# Patient Record
Sex: Male | Born: 1950 | Race: White | Hispanic: No | Smoking: Never smoker
Health system: Southern US, Community
[De-identification: ages and names within clinical notes are randomized; demographics above are authoritative.]

## PROBLEM LIST (undated history)

## (undated) DIAGNOSIS — L719 Rosacea, unspecified: Secondary | ICD-10-CM

## (undated) DIAGNOSIS — Z860100 Personal history of colon polyps, unspecified: Secondary | ICD-10-CM

## (undated) DIAGNOSIS — I471 Supraventricular tachycardia, unspecified: Secondary | ICD-10-CM

## (undated) DIAGNOSIS — R079 Chest pain, unspecified: Secondary | ICD-10-CM

## (undated) DIAGNOSIS — Z9289 Personal history of other medical treatment: Secondary | ICD-10-CM

## (undated) DIAGNOSIS — K5790 Diverticulosis of intestine, part unspecified, without perforation or abscess without bleeding: Secondary | ICD-10-CM

## (undated) DIAGNOSIS — K589 Irritable bowel syndrome without diarrhea: Secondary | ICD-10-CM

## (undated) HISTORY — DX: Chest pain, unspecified: R07.9

## (undated) HISTORY — DX: Supraventricular tachycardia, unspecified: I47.10

## (undated) HISTORY — DX: Personal history of colon polyps, unspecified: Z86.0100

## (undated) HISTORY — DX: Personal history of other medical treatment: Z92.89

## (undated) HISTORY — DX: Diverticulosis of intestine, part unspecified, without perforation or abscess without bleeding: K57.90

## (undated) HISTORY — DX: Rosacea, unspecified: L71.9

## (undated) HISTORY — DX: Personal history of colonic polyps: Z86.010

## (undated) HISTORY — PX: COLONOSCOPY: SHX174

## (undated) HISTORY — DX: Irritable bowel syndrome, unspecified: K58.9

## (undated) HISTORY — DX: Supraventricular tachycardia: I47.1

---
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
## 2002-06-29 ENCOUNTER — Emergency Department (HOSPITAL_COMMUNITY): Admission: EM | Admit: 2002-06-29 | Discharge: 2002-06-29 | Payer: Self-pay | Admitting: Emergency Medicine

## 2005-07-27 ENCOUNTER — Ambulatory Visit: Payer: Self-pay | Admitting: Gastroenterology

## 2005-08-10 ENCOUNTER — Ambulatory Visit: Payer: Self-pay | Admitting: Gastroenterology

## 2005-09-11 ENCOUNTER — Ambulatory Visit (HOSPITAL_COMMUNITY): Admission: RE | Admit: 2005-09-11 | Discharge: 2005-09-11 | Payer: Self-pay | Admitting: Gastroenterology

## 2009-05-27 ENCOUNTER — Observation Stay (HOSPITAL_COMMUNITY): Admission: EM | Admit: 2009-05-27 | Discharge: 2009-05-28 | Payer: Self-pay | Admitting: Emergency Medicine

## 2010-07-19 ENCOUNTER — Encounter: Payer: Self-pay | Admitting: Gastroenterology

## 2010-09-07 NOTE — Letter (Signed)
Summary: Colonoscopy Letter  [facility]  [address]   [place], [place] [address]   Phone: [phone]  Fax: [phone]      July 19, 2010 MRN: [id number]   Jeffrey Jimenez [address] [place], [place]  [address]   Dear [name],   According to your medical record, it is time for you to schedule a Colonoscopy. The American Cancer Society recommends this procedure as a method to detect early colon cancer. Patients with a family history of colon cancer, or a personal history of colon polyps or inflammatory bowel disease are at increased risk.  This letter has been generated based on the recommendations made at the time of your procedure. If you feel that in your particular situation this may no longer apply, please contact our office.  Please call our office at [phone] to schedule this appointment or to update your records at your earliest convenience.  Thank you for cooperating with [place] to provide you with the very best care possible.   Sincerely,   Claudette Head, M.D.  [facility] [phone]

## 2010-11-09 LAB — CARDIAC PANEL(CRET KIN+CKTOT+MB+TROPI)
CK, MB: 2.8 ng/mL (ref 0.3–4.0)
Total CK: 76 U/L (ref 7–232)
Total CK: 79 U/L (ref 7–232)
Troponin I: 0.19 ng/mL — ABNORMAL HIGH (ref 0.00–0.06)

## 2010-11-09 LAB — POCT CARDIAC MARKERS
CKMB, poc: 1.5 ng/mL (ref 1.0–8.0)
Myoglobin, poc: 62 ng/mL (ref 12–200)
Troponin i, poc: <0.05 ng/mL (ref 0.00–0.09)

## 2010-11-09 LAB — CK TOTAL AND CKMB (NOT AT ARMC): Relative Index: INVALID (ref 0.0–2.5)

## 2010-11-09 LAB — BASIC METABOLIC PANEL
BUN: 10 mg/dL (ref 6–23)
CO2: 28 mEq/L (ref 19–32)
CO2: 28 mEq/L (ref 19–32)
Calcium: 8.9 mg/dL (ref 8.4–10.5)
Calcium: 8.9 mg/dL (ref 8.4–10.5)
Chloride: 108 mEq/L (ref 96–112)
Creatinine, Ser: 0.91 mg/dL (ref 0.4–1.5)
GFR calc Af Amer: >60 mL/min (ref >60)
GFR calc non Af Amer: >60 mL/min (ref >60)
Glucose, Bld: 88 mg/dL (ref 70–99)
Potassium: 4.1 mEq/L (ref 3.5–5.1)
Sodium: 140 mEq/L (ref 135–145)
Sodium: 143 mEq/L (ref 135–145)

## 2010-11-09 LAB — DIFFERENTIAL
Basophils Absolute: 0 10*3/uL (ref 0.0–0.1)
Basophils Relative: 1 % (ref 0–1)
Eosinophils Relative: 1 % (ref 0–5)
Lymphs Abs: 1.9 10*3/uL (ref 0.7–4.0)
Monocytes Absolute: 0.9 10*3/uL (ref 0.1–1.0)
Neutro Abs: 6.7 10*3/uL (ref 1.7–7.7)
Neutrophils Relative %: 70 % (ref 43–77)

## 2010-11-09 LAB — TROPONIN I: Troponin I: 0.3 ng/mL — ABNORMAL HIGH (ref 0.00–0.06)

## 2012-04-24 ENCOUNTER — Encounter: Payer: Self-pay | Admitting: Gastroenterology

## 2012-11-10 ENCOUNTER — Emergency Department (HOSPITAL_BASED_OUTPATIENT_CLINIC_OR_DEPARTMENT_OTHER)
Admission: EM | Admit: 2012-11-10 | Discharge: 2012-11-10 | Disposition: A | Discharge status: Home / Self Care | Payer: BC Managed Care – PPO | Attending: Emergency Medicine | Admitting: Emergency Medicine

## 2012-11-10 ENCOUNTER — Encounter (HOSPITAL_BASED_OUTPATIENT_CLINIC_OR_DEPARTMENT_OTHER): Payer: Self-pay | Admitting: *Deleted

## 2012-11-10 DIAGNOSIS — I498 Other specified cardiac arrhythmias: Secondary | ICD-10-CM | POA: Insufficient documentation

## 2012-11-10 DIAGNOSIS — Z7982 Long term (current) use of aspirin: Secondary | ICD-10-CM | POA: Insufficient documentation

## 2012-11-10 DIAGNOSIS — I471 Supraventricular tachycardia: Secondary | ICD-10-CM

## 2012-11-10 DIAGNOSIS — R002 Palpitations: Secondary | ICD-10-CM | POA: Insufficient documentation

## 2012-11-10 DIAGNOSIS — Z79899 Other long term (current) drug therapy: Secondary | ICD-10-CM | POA: Insufficient documentation

## 2012-11-10 DIAGNOSIS — R42 Dizziness and giddiness: Secondary | ICD-10-CM | POA: Insufficient documentation

## 2012-11-10 HISTORY — DX: Supraventricular tachycardia, unspecified: I47.10

## 2012-11-10 HISTORY — DX: Supraventricular tachycardia: I47.1

## 2012-11-10 LAB — BASIC METABOLIC PANEL
BUN: 15 mg/dL (ref 6–23)
Chloride: 104 mEq/L (ref 96–112)
GFR calc Af Amer: 80 mL/min — ABNORMAL LOW (ref >90)
GFR calc non Af Amer: 69 mL/min — ABNORMAL LOW (ref >90)

## 2012-11-10 MED ORDER — ADENOSINE 6 MG/2ML IV SOLN
12.0000 mg | Freq: Once | INTRAVENOUS | Status: AC
  Administered 2012-11-10: 12 mg via INTRAVENOUS

## 2012-11-10 MED ORDER — ADENOSINE 6 MG/2ML IV SOLN
INTRAVENOUS | Status: AC
  Administered 2012-11-10: 6 mg
  Filled 2012-11-10: qty 6

## 2012-11-10 NOTE — ED Provider Notes (Signed)
History     CSN: [id number]  Arrival date & time 11/10/12  0848   First MD Initiated Contact with Patient 11/10/12 0848      Chief Complaint  Patient presents with  . Tachycardia    (Consider location/radiation/quality/duration/timing/severity/associated sxs/prior treatment) The history is provided by the patient.   patient has a history of paroxysmal SVT and states around 745 this morning he began to feel an episode come on. He states it feels sort of lightheaded with it. No chest pain. He states he did develop an indigestion feeling with it. No fevers. No cough. No recent caffeine intake. No relief with. With maneuvers at home. No relief with 90 mg of Cardizem that he took it home. His been doing well or weekend. He states that he did a lot of work. He sees Dr. Anne Fu for his SVT. Past Medical History  Diagnosis Date  . SVT (supraventricular tachycardia)     No past surgical history on file.  No family history on file.  History  Substance Use Topics  . Smoking status: Not on file  . Smokeless tobacco: Not on file  . Alcohol Use: Not on file      Review of Systems  Constitutional: Negative for activity change and appetite change.  HENT: Negative for neck stiffness.   Eyes: Negative for pain.  Respiratory: Negative for chest tightness and shortness of breath.   Cardiovascular: Positive for palpitations. Negative for chest pain and leg swelling.  Gastrointestinal: Negative for nausea, vomiting, abdominal pain and diarrhea.  Genitourinary: Negative for flank pain.  Musculoskeletal: Negative for back pain.  Skin: Negative for rash.  Neurological: Positive for light-headedness. Negative for weakness, numbness and headaches.  Psychiatric/Behavioral: Negative for behavioral problems.    Allergies  Review of patient's allergies indicates no known allergies.  Home Medications   Current Outpatient Rx  Name  Route  Sig  Dispense  Refill  . aspirin 81 MG tablet   Oral  Take 81 mg by mouth daily.         Marland Kitchen diltiazem (CARDIZEM) 90 MG tablet   Oral   Take 90 mg by mouth as needed.           BP 103/64  Pulse 82  Temp(Src) 98 F (36.7 C) (Oral)  Resp 18  SpO2 100%  Physical Exam  Nursing note and vitals reviewed. Constitutional: He is oriented to person, place, and time. He appears well-developed and well-nourished.  HENT:  Head: Normocephalic and atraumatic.  Eyes: EOM are normal. Pupils are equal, round, and reactive to light.  Neck: Normal range of motion. Neck supple.  Cardiovascular: Regular rhythm and normal heart sounds.   No murmur heard. Tachycardia  Pulmonary/Chest: Effort normal and breath sounds normal.  Abdominal: Soft. Bowel sounds are normal. He exhibits no distension and no mass. There is no tenderness. There is no rebound and no guarding.  Musculoskeletal: Normal range of motion. He exhibits no edema.  Neurological: He is alert and oriented to person, place, and time. No cranial nerve deficit.  Skin: Skin is warm and dry.  Psychiatric: He has a normal mood and affect.    ED Course  Procedures (including critical care time)  Labs Reviewed  BASIC METABOLIC PANEL - Abnormal; Notable for the following:    Glucose, Bld 111 (*)    GFR calc non Af Amer 69 (*)    GFR calc Af Amer 80 (*)    All other components within normal limits   No  results found.   1. SVT (supraventricular tachycardia)      Date: 11/10/2012  Rate: 175  Rhythm: supraventricular tachycardia (SVT)  QRS Axis: normal  Intervals: normal  ST/T Wave abnormalities: nonspecific ST/T changes  Conduction Disutrbances:none  Narrative Interpretation:   Old EKG Reviewed: none available   Date: 11/10/2012  Rate: 84  Rhythm: normal sinus rhythm  QRS Axis: normal  Intervals: normal  ST/T Wave abnormalities: normal  Conduction Disutrbances: none  Narrative Interpretation: unremarkable      MDM  Patient with SVT. Converted after 12 mg of adenosine  back to sinus rhythm. Repeat EKG is normal. Patient has a history of same. He will follow with his cardiologist. He does not want to be started on any prophylactic medications at this time. Doubt ischemic cause of the SVT        Roxie Gueye R. Rubin Payor, MD 11/10/12 1032

## 2012-11-10 NOTE — ED Notes (Signed)
Patient states he feels heart racing, took diltiazem this morning

## 2012-12-22 ENCOUNTER — Encounter: Payer: Self-pay | Admitting: Internal Medicine

## 2012-12-22 ENCOUNTER — Ambulatory Visit (INDEPENDENT_AMBULATORY_CARE_PROVIDER_SITE_OTHER): Payer: BC Managed Care – PPO | Admitting: Internal Medicine

## 2012-12-22 VITALS — BP 121/84 | HR 83 | Ht 72.0 in | Wt 189.0 lb

## 2012-12-22 DIAGNOSIS — I471 Supraventricular tachycardia: Secondary | ICD-10-CM

## 2012-12-22 DIAGNOSIS — I498 Other specified cardiac arrhythmias: Secondary | ICD-10-CM

## 2012-12-22 NOTE — Progress Notes (Signed)
Primary Care Physician: Thora Lance, MD Referring Physician:  Dr Pearline Cables Jeffrey Jimenez is a 62 y.o. male with a h/o SVT who presents today for EP consultation.  He reports initially having SVT 10/10.  He got out of his truck and bent over to go into work and had abrupt onset of tachypalpitations.  He presented to [facility] and was found to have a short RP tachycardia which terminated with adenosine.  He eliminated caffeine from his diet and did well until 12/13 when he again developed abrupt onset tachypalpitations while at [facility].  He reports that he had [place]. 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  He bent over and developed abrupt tachypalpitations again for which he presented to the ER and again had terminatio with adenosine.  Most recently, he had an episode of tachycardia 11/10/12.  He reports that he had just gotten up from bed and bent over with laundry, again developing tachycardia.  He presented to [facility] with short RP SVT at 178 bpm.  This was again terminated with adenosine.  During tachycardia, he reports feeling uncomfortable and anxious.  He denies SOB or CP.  Today, he denies symptoms of palpitations, orthopnea, PND, lower extremity edema, dizziness, presyncope, syncope, or neurologic sequela. The patient is tolerating medications without difficulties and is otherwise without complaint today.   Past Medical History  Diagnosis Date  . SVT (supraventricular tachycardia)   . Rosacea   . IBS (irritable bowel syndrome)   . Diverticulosis    Past Surgical History  Procedure Laterality Date  . Tonsillectomy      Current Outpatient Prescriptions  Medication Sig Dispense Refill  . aspirin 81 MG tablet Take 81 mg by mouth daily.      Marland Kitchen diltiazem (CARDIZEM) 90 MG tablet Take 90 mg by mouth as needed.       No current facility-administered medications for this visit.    No Known Allergies  History    Social History  . Marital Status: Divorced    Spouse Name: N/A    Number of Children: N/A  . Years of Education: N/A   Occupational History  . Not on file.   Social History Main Topics  . Smoking status: Never Smoker   . Smokeless tobacco: Not on file  . Alcohol Use: No  . Drug Use: No  . Sexually Active: Not on file   Other Topics Concern  . Not on file   Social History Narrative   Lives in [place] alone.  Divorced.   Retired Production assistant, radio    Family History  Problem Relation Age of Onset  . Pancreatic cancer Mother   . Heart attack Father     ROS- All systems are reviewed and negative except as per the HPI above  Physical Exam: Filed Vitals:   12/22/12 1221  BP: 121/84  Pulse: 83  Height: 6' (1.829 m)  Weight: 189 lb (85.73 kg)    GEN- The patient is well appearing, alert and oriented x 3 today.   Head- normocephalic, atraumatic Eyes-  Sclera clear, conjunctiva pink Ears- hearing intact Oropharynx- clear Neck- supple, no JVP Lymph- no cervical lymphadenopathy Lungs- Clear to ausculation bilaterally, normal work of breathing Heart- Regular rate and rhythm, no murmurs, rubs or gallops, PMI not laterally displaced GI- soft, NT, ND, + BS Extremities- no clubbing, cyanosis, or edema MS- no significant deformity or atrophy Skin- no rash  or lesion Psych- euthymic mood, full affect Neuro- strength and sensation are intact  EKG 11/10/12 reveals short RP narrow complex tachycardia EKG today reveals sinus rhythm 81 bpm, PR 158 msec, otherwise normal ekg  Assessment and Plan:  The patient has symptomatic adenosine sensitive narrow complex short RP tachycardia. Therapeutic strategies for supraventricular tachycardia including medicine and ablation were discussed in detail with the patient today. Risk, benefits, and alternatives to EP study and radiofrequency ablation were also discussed in detail today. These risks include but are not limited to  stroke, bleeding, vascular damage, tamponade, perforation, damage to the heart and other structures, AV block requiring pacemaker, worsening renal function, and death. The patient understands these risk and wishes to further contemplate his options.  He is not ready yet to proceed with ablation.  Educational material was provided today regarding both SVT and ablation.  He will contact my office if he wishes to proceed.  Vagal maneuvers were discussed today.  He will follow-up with Dr Anne Fu and I will see as needed.

## 2012-12-22 NOTE — Patient Instructions (Addendum)
Your physician recommends that you schedule a follow-up appointment as needed   Call when ready to schedule [phone]

## 2013-05-06 ENCOUNTER — Encounter: Payer: Self-pay | Admitting: Internal Medicine

## 2013-05-07 ENCOUNTER — Encounter: Payer: Self-pay | Admitting: Internal Medicine

## 2013-05-07 ENCOUNTER — Ambulatory Visit (INDEPENDENT_AMBULATORY_CARE_PROVIDER_SITE_OTHER): Payer: BC Managed Care – PPO | Admitting: Internal Medicine

## 2013-05-07 VITALS — BP 124/74 | HR 69 | Ht 72.0 in | Wt 178.0 lb

## 2013-05-07 DIAGNOSIS — I498 Other specified cardiac arrhythmias: Secondary | ICD-10-CM

## 2013-05-07 DIAGNOSIS — I471 Supraventricular tachycardia: Secondary | ICD-10-CM

## 2013-05-07 NOTE — Patient Instructions (Signed)
Your physician recommends that you schedule a follow-up appointment as needed  

## 2013-05-07 NOTE — Assessment & Plan Note (Signed)
I've discussed the treatment options with the patient. While he is required 3 visits to the emergency room for treatment of his SVT, and had non-ST elevation MI is associated with these visits, his episodes are infrequent and are not particularly worrisome for him.  We discussed the risk, goals, benefits, and expectations of catheter ablation. We also discussed alternatives to catheter ablation.  For now, he will undergo watchful waiting. If his episodes increase in frequency and severity, catheter ablation would be a strong consideration.

## 2013-05-07 NOTE — Progress Notes (Signed)
HPI Jeffrey Jimenez returns today for followup. He is a very pleasant 62 year old man with see my partner Dr. Johney Frame approximate 6 months ago. He has a history of SVT and hospital visits for SVT dating back to 2010. Intravenous adenosine terminates the episodes of SVT. With 2 of the episodes, he had a class II MI. The patient states that these episodes start and stop suddenly. He has not had frank syncope but does get dizzy. Typically they occur when he stands up after bending over. On each occasion, the episodes have lasted for over an hour. He has not been able terminated with vagal maneuvers. He does not have evidence of preexcitation on his electrocardiogram. He has not been on medical therapy, and the episodes have been fairly infrequent, and the patient does not like to take medications. No Known Allergies   Current Outpatient Prescriptions  Medication Sig Dispense Refill  . aspirin 81 MG tablet Take 81 mg by mouth daily.      Marland Kitchen diltiazem (CARDIZEM) 90 MG tablet Take 90 mg by mouth as needed (for episodes of rapid heart rate).        No current facility-administered medications for this visit.     Past Medical History  Diagnosis Date  . Rosacea   . IBS (irritable bowel syndrome)   . Diverticulosis     Found by colonoscopy in 2010  . Hx of colonic polyps     colonoscopy in 2007  . Paroxysmal supraventricular tachycardia     Diltiazem PRN. Nuc stress test 05/31/09 showed no evidence of ischemia, normal EF & no significant changes from previous study in 07/26/05. ECHO 05/31/09 showed normal EF & mild MR.  . Chest pain     Nuc stress test 05/31/09 showed no evidence of ischemia, normal EF & no significant changes from previous study in 07/26/05. ECHO 05/31/09 showed normal EF & mild MR.    ROS:   All systems reviewed and negative except as noted in the HPI.   Past Surgical History  Procedure Laterality Date  . Tonsillectomy and adenoidectomy  1957  . Colonoscopy  2007 &  2010    Hx of colon polyps & diverticulosis     Family History  Problem Relation Age of Onset  . Pancreatic cancer Mother   . Liver cancer Mother   . Heart attack Father      History   Social History  . Marital Status: Divorced    Spouse Name: N/A    Number of Children: N/A  . Years of Education: N/A   Occupational History  . Not on file.   Social History Main Topics  . Smoking status: Never Smoker   . Smokeless tobacco: Not on file  . Alcohol Use: No  . Drug Use: No  . Sexual Activity: Not on file   Other Topics Concern  . Not on file   Social History Narrative   Lives in [place] alone.  Divorced.   Retired Production assistant, radio     BP 124/74  Pulse 69  Ht 6' (1.829 m)  Wt 178 lb (80.74 kg)  BMI 24.14 kg/m2  Physical Exam:  Well appearing 62 year old man, NAD HEENT: Unremarkable Neck:  No JVD, no thyromegally Back:  No CVA tenderness Lungs:  Clear with no wheezes, rales, or rhonchi. HEART:  Regular rate rhythm, no murmurs, no rubs, no clicks Abd:  soft, positive bowel sounds, no organomegally, no rebound, no guarding Ext:  2 plus pulses, no  edema, no cyanosis, no clubbing Skin:  No rashes no nodules Neuro:  CN II through XII intact, motor grossly intact  EKG - normal sinus rhythm with nonspecific T wave abnormality   Assess/Plan:

## 2014-03-29 ENCOUNTER — Ambulatory Visit (INDEPENDENT_AMBULATORY_CARE_PROVIDER_SITE_OTHER): Payer: Medicare Other | Admitting: Cardiology

## 2014-03-29 ENCOUNTER — Encounter: Payer: Self-pay | Admitting: Cardiology

## 2014-03-29 VITALS — BP 104/80 | HR 66 | Ht 72.0 in | Wt 184.0 lb

## 2014-03-29 DIAGNOSIS — I471 Supraventricular tachycardia: Secondary | ICD-10-CM

## 2014-03-29 DIAGNOSIS — I498 Other specified cardiac arrhythmias: Secondary | ICD-10-CM

## 2014-03-29 NOTE — Progress Notes (Signed)
HPI: Mr. Tomasik returns today for followup. He is a very pleasant 63 year old man with see my partner Dr. Rayann Heman Dr. Lovena Le. He has a history of SVT and hospital visits for SVT dating back to 2010. Intravenous adenosine terminates the episodes of SVT. With 2 of the episodes, he had a class II MI. The patient states that these episodes start and stop suddenly. He has not had frank syncope but does get dizzy. Typically they occur when he stands up after bending over. On each occasion, the episodes have lasted for over an hour. He has not been able terminated with vagal maneuvers. He does not have evidence of preexcitation on his electrocardiogram. He has not been on medical therapy, and the episodes have been fairly infrequent, and the patient does not like to take medications. Has has demand ischemia associate with these.   Just had another event on 03/11/14 - was at son's house. Tried Valsalva. Son pushed against belly. The best for him was deep breathing. Relaxed. Went away. 20-39min. Total. Took a diltiazem within 5 min of start. No CP, no SOB.   No Known Allergies   Current Outpatient Prescriptions  Medication Sig Dispense Refill  . aspirin 81 MG tablet Take 81 mg by mouth daily.      Marland Kitchen diltiazem (CARDIZEM) 90 MG tablet Take 90 mg by mouth as needed (for episodes of rapid heart rate).        No current facility-administered medications for this visit.     Past Medical History  Diagnosis Date  . Rosacea   . IBS (irritable bowel syndrome)   . Diverticulosis     Found by colonoscopy in 2010  . Hx of colonic polyps     colonoscopy in 2007  . Paroxysmal supraventricular tachycardia     Diltiazem PRN. Nuc stress test 05/31/09 showed no evidence of ischemia, normal EF & no significant changes from previous study in 07/26/05. ECHO 05/31/09 showed normal EF & mild MR.  . Chest pain     Nuc stress test 05/31/09 showed no evidence of ischemia, normal EF & no significant changes from  previous study in 07/26/05. ECHO 05/31/09 showed normal EF & mild MR.    ROS:   All systems reviewed and negative except as noted in the HPI.   Past Surgical History  Procedure Laterality Date  . Tonsillectomy and adenoidectomy  1957  . Colonoscopy  2007 & 2010    Hx of colon polyps & diverticulosis     Family History  Problem Relation Age of Onset  . Pancreatic cancer Mother   . Liver cancer Mother   . Heart attack Father      History   Social History  . Marital Status: Divorced    Spouse Name: N/A    Number of Children: N/A  . Years of Education: N/A   Occupational History  . Not on file.   Social History Main Topics  . Smoking status: Never Smoker   . Smokeless tobacco: Not on file  . Alcohol Use: No  . Drug Use: No  . Sexual Activity: Not on file   Other Topics Concern  . Not on file   Social History Narrative   Lives in [place] alone.  Divorced.   Retired Primary school teacher     BP 104/80  Pulse 66  Ht 6' (1.829 m)  Wt 184 lb (83.462 kg)  BMI 24.95 kg/m2  Physical Exam:  Well appearing 63 year old man, NAD HEENT:  Unremarkable Neck:  No JVD, no thyromegally Back:  No CVA tenderness Lungs:  Clear with no wheezes, rales, or rhonchi. HEART:  Regular rate rhythm, no murmurs, no rubs, no clicks Abd:  soft, positive bowel sounds, no organomegally, no rebound, no guarding Ext:  2 plus pulses, no edema, no cyanosis, no clubbing Skin:  No rashes no nodules Neuro:  CN II through XII intact, motor grossly intact  EKG - 03/29/14 - NSR, NSSTW changes. No change from prior.    Assessment/Plan:  1. SVT-he has been evaluated by electrophysiology, last by Dr. Lovena Le. Ablation was offered however patient wished to wait. We had lengthy discussion today about ablative therapy. Encouraged him to discuss this further with Dr. Lovena Le. With his blood pressure being 104/80, addition of daily diltiazem because more orthostatic hypotension. He is very  active. Hopefully, he will go forward with procedure.   2. One year follow up.

## 2014-03-29 NOTE — Patient Instructions (Signed)
The current medical regimen is effective;  continue present plan and medications.  Follow up in 1 year with Dr Skains.  You will receive a letter in the mail 2 months before you are due.  Please call us when you receive this letter to schedule your follow up appointment.  

## 2014-04-08 ENCOUNTER — Encounter: Payer: Self-pay | Admitting: Internal Medicine

## 2014-04-08 ENCOUNTER — Ambulatory Visit (INDEPENDENT_AMBULATORY_CARE_PROVIDER_SITE_OTHER): Payer: Medicare Other | Admitting: Internal Medicine

## 2014-04-08 VITALS — BP 115/80 | HR 76 | Ht 72.0 in | Wt 183.0 lb

## 2014-04-08 DIAGNOSIS — I498 Other specified cardiac arrhythmias: Secondary | ICD-10-CM

## 2014-04-08 DIAGNOSIS — I471 Supraventricular tachycardia: Secondary | ICD-10-CM

## 2014-04-08 NOTE — Assessment & Plan Note (Signed)
Today we've had an extensive discussion about the risk and benefit, pros and cons, and all aspects of catheter ablation. The patient is considering his options, we have given him dates for which he may schedule and ablation, and will call us if you would like to proceed with catheter ablation. Because the patient is unable take medications do to chronic low blood pressure, and because he has had a type II MI on several occasions, I have strongly encouraged him to consider catheter ablation of his SVT.

## 2014-04-08 NOTE — Patient Instructions (Addendum)
Your physician recommends that you continue on your current medications as directed. Please refer to the Current Medication list given to you today.  Today Dr. Lovena Le discussed ablation. Some possible dates: September 9/23, 9/28 October 10/1, 10/5, 10/9, 10/19, 10/23 Cardiac Ablation Cardiac ablation is a procedure to disable a small amount of heart tissue in very specific places. The heart has many electrical connections. Sometimes these connections are abnormal and can cause the heart to beat very fast or irregularly. By disabling some of the problem areas, heart rhythm can be improved or made normal. Ablation is done for people who:   Have Wolff-Parkinson-White syndrome.   Have other fast heart rhythms (tachycardia).   Have taken medicines for an abnormal heart rhythm (arrhythmia) that resulted in:   No success.   Side effects.   May have a high-risk heartbeat that could result in death.  LET [facility] CARE PROVIDER KNOW ABOUT:   Any allergies you have or any previous reactions you have had to X-ray dye, food (such as seafood), medicine, or tape.   All medicines you are taking, including vitamins, herbs, eye drops, creams, and over-the-counter medicines.   Previous problems you or members of your family have had with the use of anesthetics.   Any blood disorders you have.   Previous surgeries or procedures (such as a kidney transplant) you have had.   Medical conditions you have (such as kidney failure).  RISKS AND COMPLICATIONS Generally, cardiac ablation is a safe procedure. However, problems can occur and include:   Increased risk of cancer. Depending on how long it takes to do the ablation, the dose of radiation can be high.  Bruising and bleeding where a thin, flexible tube (catheter) was inserted during the procedure.   Bleeding into the chest, especially into the sac that surrounds the heart (serious).  Need for a permanent pacemaker if the normal  electrical system is damaged.   The procedure may not be fully effective, and this may not be recognized for months. Repeat ablation procedures are sometimes required. BEFORE THE PROCEDURE   Follow any instructions from your health care provider regarding eating and drinking before the procedure.   Take your medicines as directed at regular times with water, unless instructed otherwise by your health care provider. If you are taking diabetes medicine, including insulin, ask how you are to take it and if there are any special instructions you should follow. It is common to adjust insulin dosing the day of the ablation.  PROCEDURE  An ablation is usually performed in a catheterization laboratory with the guidance of fluoroscopy. Fluoroscopy is a type of X-ray that helps your health care provider see images of your heart during the procedure.   An ablation is a minimally invasive procedure. This means a small cut (incision) is made in either your neck or groin. Your health care provider will decide where to make the incision based on your medical history and physical exam.  An IV tube will be started before the procedure begins. You will be given an anesthetic or medicine to help you relax (sedative).  The skin on your neck or groin will be numbed. A needle will be inserted into a large vein in your neck or groin and catheters will be threaded to your heart.  A special dye that shows up on fluoroscopy pictures may be injected through the catheter. The dye helps your health care provider see the area of the heart that needs treatment.  The catheter  has electrodes on the tip. When the area of heart tissue that is causing the arrhythmia is found, the catheter tip will send an electrical current to the area and "scar" the tissue. Three types of energy can be used to ablate the heart tissue:   Heat (radiofrequency energy).   Laser energy.   Extreme cold (cryoablation).   When the area of  the heart has been ablated, the catheter will be taken out. Pressure will be held on the insertion site. This will help the insertion site clot and keep it from bleeding. A bandage will be placed on the insertion site.  AFTER THE PROCEDURE   After the procedure, you will be taken to a recovery area where your vital signs (blood pressure, heart rate, and breathing) will be monitored. The insertion site will also be monitored for bleeding.   You will need to lie still for 4-6 hours. This is to ensure you do not bleed from the catheter insertion site.  Document Released: 12/09/2008 Document Revised: 12/07/2013 Document Reviewed: 12/15/2012 [facility] Patient Information 2015 [place], [place]. This information is not intended to replace advice given to you by your health care provider. Make sure you discuss any questions you have with your health care provider.

## 2014-04-08 NOTE — Progress Notes (Signed)
HPI Mr. Mallek returns today for followup. He is a very pleasant 63 year old man who has a history of SVT and hospital visits for SVT dating back to 2010. Intravenous adenosine terminates the episodes of SVT. With 2 of the episodes, he had a class II MI. The patient states that these episodes start and stop suddenly. He has not had frank syncope but does get dizzy. Typically they occur when he stands up after bending over. On each occasion, the episodes have lasted for over an hour. He has not been able to terminate the episodes with vagal maneuvers. He does not have evidence of preexcitation on his electrocardiogram. He has not been on medical therapy because he has low blood pressure at baseline, and the episodes have been fairly infrequent, and the patient does not like to take medications. No Known Allergies   Current Outpatient Prescriptions  Medication Sig Dispense Refill  . aspirin 81 MG tablet Take 81 mg by mouth daily.      Marland Kitchen diltiazem (CARDIZEM) 90 MG tablet Take 90 mg by mouth as needed (for episodes of rapid heart rate).        No current facility-administered medications for this visit.     Past Medical History  Diagnosis Date  . Rosacea   . IBS (irritable bowel syndrome)   . Diverticulosis     Found by colonoscopy in 2010  . Hx of colonic polyps     colonoscopy in 2007  . Paroxysmal supraventricular tachycardia     Diltiazem PRN. Nuc stress test 05/31/09 showed no evidence of ischemia, normal EF & no significant changes from previous study in 07/26/05. ECHO 05/31/09 showed normal EF & mild MR.  . Chest pain     Nuc stress test 05/31/09 showed no evidence of ischemia, normal EF & no significant changes from previous study in 07/26/05. ECHO 05/31/09 showed normal EF & mild MR.  . SVT (supraventricular tachycardia)     ROS:   All systems reviewed and negative except as noted in the HPI.   Past Surgical History  Procedure Laterality Date  . Tonsillectomy  and adenoidectomy  1957  . Colonoscopy  2007 & 2010    Hx of colon polyps & diverticulosis     Family History  Problem Relation Age of Onset  . Pancreatic cancer Mother   . Liver cancer Mother   . Heart attack Father      History   Social History  . Marital Status: Divorced    Spouse Name: N/A    Number of Children: N/A  . Years of Education: N/A   Occupational History  . Not on file.   Social History Main Topics  . Smoking status: Never Smoker   . Smokeless tobacco: Not on file  . Alcohol Use: No  . Drug Use: No  . Sexual Activity: Not on file   Other Topics Concern  . Not on file   Social History Narrative   Lives in [place] alone.  Divorced.   Retired Primary school teacher     BP 115/80  Pulse 76  Ht 6' (1.829 m)  Wt 183 lb (83.008 kg)  BMI 24.81 kg/m2  Physical Exam:  Well appearing 63 year old man, NAD HEENT: Unremarkable Neck:  No JVD, no thyromegally Back:  No CVA tenderness Lungs:  Clear with no wheezes, rales, or rhonchi. HEART:  Regular rate rhythm, no murmurs, no rubs, no clicks Abd:  soft, positive bowel sounds, no organomegally, no rebound, no  guarding Ext:  2 plus pulses, no edema, no cyanosis, no clubbing Skin:  No rashes no nodules Neuro:  CN II through XII intact, motor grossly intact  EKG - normal sinus rhythm with nonspecific T wave abnormality   Assess/Plan:

## 2014-04-30 ENCOUNTER — Ambulatory Visit: Payer: BC Managed Care – PPO | Admitting: Cardiology

## 2016-02-17 DIAGNOSIS — J069 Acute upper respiratory infection, unspecified: Secondary | ICD-10-CM | POA: Diagnosis not present

## 2016-02-18 ENCOUNTER — Emergency Department (HOSPITAL_BASED_OUTPATIENT_CLINIC_OR_DEPARTMENT_OTHER)
Admission: EM | Admit: 2016-02-18 | Discharge: 2016-02-18 | Disposition: A | Discharge status: Home / Self Care | Payer: PPO | Attending: Emergency Medicine | Admitting: Emergency Medicine

## 2016-02-18 ENCOUNTER — Encounter (HOSPITAL_BASED_OUTPATIENT_CLINIC_OR_DEPARTMENT_OTHER): Payer: Self-pay | Admitting: Emergency Medicine

## 2016-02-18 DIAGNOSIS — R509 Fever, unspecified: Secondary | ICD-10-CM | POA: Insufficient documentation

## 2016-02-18 DIAGNOSIS — I499 Cardiac arrhythmia, unspecified: Secondary | ICD-10-CM | POA: Diagnosis not present

## 2016-02-18 DIAGNOSIS — Z7982 Long term (current) use of aspirin: Secondary | ICD-10-CM | POA: Diagnosis not present

## 2016-02-18 DIAGNOSIS — R05 Cough: Secondary | ICD-10-CM | POA: Diagnosis not present

## 2016-02-18 DIAGNOSIS — I471 Supraventricular tachycardia: Secondary | ICD-10-CM | POA: Insufficient documentation

## 2016-02-18 LAB — BASIC METABOLIC PANEL
Anion gap: 8 (ref 5–15)
BUN: 14 mg/dL (ref 6–20)
CO2: 24 mmol/L (ref 22–32)
Calcium: 8.6 mg/dL — ABNORMAL LOW (ref 8.9–10.3)
Chloride: 103 mmol/L (ref 101–111)
Creatinine, Ser: 1.11 mg/dL (ref 0.61–1.24)
GFR calc Af Amer: >60 mL/min (ref >60)
GFR calc non Af Amer: >60 mL/min (ref >60)
Glucose, Bld: 124 mg/dL — ABNORMAL HIGH (ref 65–99)
Potassium: 3.5 mmol/L (ref 3.5–5.1)
Sodium: 135 mmol/L (ref 135–145)

## 2016-02-18 LAB — CBC WITH DIFFERENTIAL/PLATELET
Basophils Absolute: 0 10*3/uL (ref 0.0–0.1)
Basophils Relative: 0 %
Eosinophils Absolute: 0.2 10*3/uL (ref 0.0–0.7)
Eosinophils Relative: 1 %
HCT: 36.5 % — ABNORMAL LOW (ref 39.0–52.0)
Hemoglobin: 13.2 g/dL (ref 13.0–17.0)
Lymphocytes Relative: 28 %
Lymphs Abs: 3.2 10*3/uL (ref 0.7–4.0)
MCH: 32.4 pg (ref 26.0–34.0)
MCHC: 36.2 g/dL — ABNORMAL HIGH (ref 30.0–36.0)
MCV: 89.5 fL (ref 78.0–100.0)
Monocytes Absolute: 1 10*3/uL (ref 0.1–1.0)
Monocytes Relative: 9 %
Neutro Abs: 7.1 10*3/uL (ref 1.7–7.7)
Neutrophils Relative %: 62 %
Platelets: 228 10*3/uL (ref 150–400)
RBC: 4.08 MIL/uL — ABNORMAL LOW (ref 4.22–5.81)
RDW: 12.1 % (ref 11.5–15.5)
WBC: 11.5 10*3/uL — ABNORMAL HIGH (ref 4.0–10.5)

## 2016-02-18 MED ORDER — ADENOSINE 6 MG/2ML IV SOLN
INTRAVENOUS | Status: AC
  Filled 2016-02-18: qty 8

## 2016-02-18 NOTE — ED Provider Notes (Signed)
CSN: [id number]     Arrival date & time 02/18/16  2026 History   By signing my name below, I, Terrance Branch, attest that this documentation has been prepared under the direction and in the presence of Fredia Sorrow, MD. Electronically Signed: Randa Evens, ED Scribe. 02/18/2016. 9:44 PM.    Chief Complaint  Patient presents with  . Irregular Heart Beat   HPI HPI Comments: Jeffrey Jimenez is a 65 y.o. male who presents to the Emergency Department complaining of palpitations that he describes as his hear racing that began tonight at 6:30 PM. Pt reports that he had associated light headedness. Pt states that he has URI symptoms and believes that has made his symptoms worse. Pt states that he has tried Cardizem that has provided relief. Pt reports that he has a Hx SVT. Denies CP, SOB, nausea, vomiting  Past Medical History  Diagnosis Date  . Rosacea   . IBS (irritable bowel syndrome)   . Diverticulosis     Found by colonoscopy in 2010  . Hx of colonic polyps     colonoscopy in 2007  . Paroxysmal supraventricular tachycardia (HCC)     Diltiazem PRN. Nuc stress test 05/31/09 showed no evidence of ischemia, normal EF & no significant changes from previous study in 07/26/05. ECHO 05/31/09 showed normal EF & mild MR.  . Chest pain     Nuc stress test 05/31/09 showed no evidence of ischemia, normal EF & no significant changes from previous study in 07/26/05. ECHO 05/31/09 showed normal EF & mild MR.  . SVT (supraventricular tachycardia) [facility])    Past Surgical History  Procedure Laterality Date  . Tonsillectomy and adenoidectomy  1957  . Colonoscopy  2007 & 2010    Hx of colon polyps & diverticulosis   Family History  Problem Relation Age of Onset  . Pancreatic cancer Mother   . Liver cancer Mother   . Heart attack Father    Social History  Substance Use Topics  . Smoking status: Never Smoker   . Smokeless tobacco: None  . Alcohol Use: No    Review of Systems   Constitutional: Positive for fever. Negative for chills.  HENT: Positive for rhinorrhea. Negative for sore throat.   Eyes: Negative for visual disturbance.  Respiratory: Positive for cough. Negative for shortness of breath.   Cardiovascular: Negative for chest pain and leg swelling.  Gastrointestinal: Negative for nausea, vomiting, abdominal pain and diarrhea.  Genitourinary: Negative for dysuria.  Musculoskeletal: Negative for myalgias, back pain and neck pain.  Skin: Negative for rash.  Neurological: Positive for light-headedness and headaches.  Hematological: Does not bruise/bleed easily.  Psychiatric/Behavioral: Negative for confusion.      Allergies  Review of patient's allergies indicates no known allergies.  Home Medications   Prior to Admission medications   Medication Sig Start Date End Date Taking? Authorizing Provider  aspirin 81 MG tablet Take 81 mg by mouth daily.    Historical Provider, MD  diltiazem (CARDIZEM) 90 MG tablet Take 90 mg by mouth as needed (for episodes of rapid heart rate).     Historical Provider, MD   BP 102/67 mmHg  Pulse 82  Temp(Src) 99.4 F (37.4 C) (Oral)  Resp 24  Ht 6' (1.829 m)  Wt 84.369 kg  BMI 25.22 kg/m2  SpO2 93%   Physical Exam  Constitutional: He is oriented to person, place, and time. He appears well-developed and well-nourished. No distress.  HENT:  Head: Normocephalic and atraumatic.  Mouth/Throat: Uvula  is midline and oropharynx is clear and moist.  Eyes: Conjunctivae and EOM are normal. Pupils are equal, round, and reactive to light. No scleral icterus.  Neck: Neck supple. No tracheal deviation present.  Cardiovascular: Normal rate, regular rhythm and normal heart sounds.   Pulmonary/Chest: Effort normal and breath sounds normal. No respiratory distress. He has no wheezes. He has no rales.  Abdominal: Bowel sounds are normal. There is no tenderness.  Musculoskeletal: Normal range of motion. He exhibits no edema.   Neurological: He is alert and oriented to person, place, and time. No cranial nerve deficit.  Skin: Skin is warm and dry.  Psychiatric: He has a normal mood and affect. His behavior is normal.  Nursing note and vitals reviewed.   ED Course  Procedures (including critical care time) DIAGNOSTIC STUDIES: Oxygen Saturation is 100% on RA, normal by my interpretation.    COORDINATION OF CARE: 9:44 PM-Discussed treatment plan which includes EKG with pt at bedside and pt agreed to plan.     Labs Review Labs Reviewed  CBC WITH DIFFERENTIAL/PLATELET - Abnormal; Notable for the following:    WBC 11.5 (*)    RBC 4.08 (*)    HCT 36.5 (*)    MCHC 36.2 (*)    All other components within normal limits  BASIC METABOLIC PANEL - Abnormal; Notable for the following:    Glucose, Bld 124 (*)    Calcium 8.6 (*)    All other components within normal limits   Results for orders placed or performed during the hospital encounter of 02/18/16  CBC with Differential/Platelet  Result Value Ref Range   WBC 11.5 (H) 4.0 - 10.5 K/uL   RBC 4.08 (L) 4.22 - 5.81 MIL/uL   Hemoglobin 13.2 13.0 - 17.0 g/dL   HCT 36.5 (L) 39.0 - 52.0 %   MCV 89.5 78.0 - 100.0 fL   MCH 32.4 26.0 - 34.0 pg   MCHC 36.2 (H) 30.0 - 36.0 g/dL   RDW 12.1 11.5 - 15.5 %   Platelets 228 150 - 400 K/uL   Neutrophils Relative % 62 %   Neutro Abs 7.1 1.7 - 7.7 K/uL   Lymphocytes Relative 28 %   Lymphs Abs 3.2 0.7 - 4.0 K/uL   Monocytes Relative 9 %   Monocytes Absolute 1.0 0.1 - 1.0 K/uL   Eosinophils Relative 1 %   Eosinophils Absolute 0.2 0.0 - 0.7 K/uL   Basophils Relative 0 %   Basophils Absolute 0.0 0.0 - 0.1 K/uL  Basic metabolic panel  Result Value Ref Range   Sodium 135 135 - 145 mmol/L   Potassium 3.5 3.5 - 5.1 mmol/L   Chloride 103 101 - 111 mmol/L   CO2 24 22 - 32 mmol/L   Glucose, Bld 124 (H) 65 - 99 mg/dL   BUN 14 6 - 20 mg/dL   Creatinine, Ser 1.11 0.61 - 1.24 mg/dL   Calcium 8.6 (L) 8.9 - 10.3 mg/dL   GFR  calc non Af Amer >60 >60 mL/min   GFR calc Af Amer >60 >60 mL/min   Anion gap 8 5 - 15     Imaging Review No results found.    EKG Interpretation   Date/Time:  Saturday February 18 2016 21:45:04 EDT Ventricular Rate:  93 PR Interval:    QRS Duration: 94 QT Interval:  336 QTC Calculation: 418 R Axis:   42 Text Interpretation:  Sinus rhythm Confirmed by Violanda Bobeck  MD, Randalyn Ahmed  ([id number]) on 02/18/2016 9:49:41 PM  MDM   Final diagnoses:  SVT (supraventricular tachycardia) ([place])   Patient with onset of rapid heart rate around 6 in the evening. It persisted. Patient arrived here with heart rate of 155 consistent with a supraventricular tachycardia. It resolves spontaneously without any treatment on our part. Follow-up EKG was consistent with sinus rhythm. Patient did take his diltiazem a.m. at home. Patient's labs without any significant abnormalities. Patient is suffering from an upper respiratory infection but had no significant chest pain or shortness of breath.  I personally performed the services described in this documentation, which was scribed in my presence. The recorded information has been reviewed and is accurate.        Fredia Sorrow, MD 02/18/16 [phone]

## 2016-02-18 NOTE — ED Notes (Signed)
Patient still in triage, converted back into a NSR HR of 91

## 2016-02-18 NOTE — ED Notes (Signed)
Patient started to have palpitations and increased HR since about 630 pm

## 2016-02-18 NOTE — ED Notes (Signed)
Pt given d/c instructions as per chart. Verbalizes understanding. No questions. 

## 2016-02-18 NOTE — ED Notes (Signed)
Pt states he laid down this evening and felt his heart start racing. Took dltiazem about 30 min into event. States he is usually able to slow his HR on his own by deep breathing, but things "were not conducive to this this evening". Also ride over here was not relaxing either, but at triage, he was able to relax. Just takes diltiazem when this flares up.

## 2016-02-18 NOTE — Discharge Instructions (Signed)
Return for any new or worse symptoms. If heart rate starts going fast again and has not resolved within 40 minutes of return or reevaluation. Continue take your medicines as directed.

## 2016-02-20 ENCOUNTER — Telehealth: Payer: Self-pay | Admitting: Internal Medicine

## 2016-02-20 NOTE — Telephone Encounter (Signed)
New message   pt verbalized that he has been seen over the weekend 02/18/16  pt states he went in for SVT event  mrn [id number] [facility]  Pt verbalized that he is not having any issues today and he wants both Dr.Skains and Dr.Taylor aware

## 2016-02-20 NOTE — Telephone Encounter (Signed)
Message sent to Medical Records to request recent hospital notes.

## 2016-02-21 NOTE — Telephone Encounter (Signed)
Notes available in Chart Review for MD review.  To Dr. Marlou Porch and Dr. Lovena Le.

## 2016-03-20 ENCOUNTER — Encounter: Payer: Self-pay | Admitting: Cardiology

## 2016-04-03 ENCOUNTER — Telehealth: Payer: Self-pay | Admitting: Cardiology

## 2016-04-03 ENCOUNTER — Encounter (INDEPENDENT_AMBULATORY_CARE_PROVIDER_SITE_OTHER): Payer: Self-pay

## 2016-04-03 ENCOUNTER — Ambulatory Visit (INDEPENDENT_AMBULATORY_CARE_PROVIDER_SITE_OTHER): Payer: PPO | Admitting: Cardiology

## 2016-04-03 ENCOUNTER — Encounter: Payer: Self-pay | Admitting: Cardiology

## 2016-04-03 VITALS — BP 118/80 | HR 67 | Ht 72.0 in | Wt 183.1 lb

## 2016-04-03 DIAGNOSIS — I471 Supraventricular tachycardia: Secondary | ICD-10-CM | POA: Diagnosis not present

## 2016-04-03 MED ORDER — DILTIAZEM HCL 90 MG PO TABS: 90.0000 mg | ORAL_TABLET | ORAL | 99 refills | Status: DC | PRN

## 2016-04-03 MED ORDER — DILTIAZEM HCL ER COATED BEADS 120 MG PO CP24: 120.0000 mg | ORAL_CAPSULE | Freq: Every day | ORAL | 11 refills | Status: DC

## 2016-04-03 NOTE — Patient Instructions (Signed)
Medication Instructions:  Please start Cardizem CD 120 mg a day and take Cardizem 90 mg as needed only. Continue all other medications as listed.  Follow-Up: Follow up in 6 months with Dr. Marlou Porch.  You will receive a letter in the mail 2 months before you are due.  Please call us when you receive this letter to schedule your follow up appointment.  If you need a refill on your cardiac medications before your next appointment, please call your pharmacy.  Thank you for choosing [facility]!!

## 2016-04-03 NOTE — Telephone Encounter (Signed)
New Message  Geni Bers from [facility] call requesting to speak with RN about a refill request send electronically. Please call back to discuss

## 2016-04-03 NOTE — Telephone Encounter (Signed)
Pharmacist was calling to clarify the frequency of new RX sent. Per Dr. Marlou Porch pt can be take Cardizem 90 mg PO QD PRN for palpitations in conjunction with taking Cardizem 120 mg PO QD. Pharmacists is agreeable and will fill accordingly.

## 2016-04-03 NOTE — Progress Notes (Signed)
HPI: [name] returns today for followup. He is a very pleasant 65 year old man with see my partners Jeffrey Jimenez/ Jeffrey Jimenez. He has a history of SVT and hospital visits for SVT dating back to 2010. Intravenous adenosine terminates the episodes of SVT. With 2 of the episodes, he had a class II MI. The patient states that these episodes start and stop suddenly. He has not had frank syncope but does get dizzy. Typically they occur when he stands up after bending over. On each occasion, the episodes have lasted for over an hour. He has not been able terminated with vagal maneuvers. He does not have evidence of preexcitation on his electrocardiogram. He has not been on medical therapy, and the episodes have been fairly infrequent, and the patient does not like to take medications.  Has has demand ischemia associate with these.   Just had another event on 03/11/14 - was at son's house. Tried Valsalva. Son pushed against belly. The best for him was deep breathing. Relaxed. Went away. 20-75min. Total. Took a diltiazem within 5 min of start. No CP, no SOB.   Had one 02/18/16, HR 155, resolved in ER spontaneously. Jeffrey Jimenez on 04/08/14 strongly encouraged ablation. He states that he is not quite ready for ablation yet. He is however ready to try Cardizem CD. We discussed potential side effects.  No Known Allergies   Current Outpatient Prescriptions  Medication Sig Dispense Refill  . aspirin 81 MG tablet Take 81 mg by mouth daily.    Marland Kitchen diltiazem (CARDIZEM) 90 MG tablet Take 90 mg by mouth as needed (for episodes of rapid heart rate).      No current facility-administered medications for this visit.      Past Medical History:  Diagnosis Date  . Chest pain    Nuc stress test 05/31/09 showed no evidence of ischemia, normal EF & no significant changes from previous study in 07/26/05. ECHO 05/31/09 showed normal EF & mild MR.  . Diverticulosis    Found by colonoscopy in 2010  . Hx of colonic  polyps    colonoscopy in 2007  . IBS (irritable bowel syndrome)   . Paroxysmal supraventricular tachycardia (HCC)    Diltiazem PRN. Nuc stress test 05/31/09 showed no evidence of ischemia, normal EF & no significant changes from previous study in 07/26/05. ECHO 05/31/09 showed normal EF & mild MR.  . Rosacea   . SVT (supraventricular tachycardia) (HCC)     ROS:   All systems reviewed and negative except as noted in the HPI.   Past Surgical History:  Procedure Laterality Date  . COLONOSCOPY  2007 & 2010   Hx of colon polyps & diverticulosis  . TONSILLECTOMY AND ADENOIDECTOMY  1957     Family History  Problem Relation Age of Onset  . Pancreatic cancer Mother   . Liver cancer Mother   . Heart attack Father      Social History   Social History  . Marital status: Divorced    Spouse name: N/A  . Number of children: N/A  . Years of education: N/A   Occupational History  . Not on file.   Social History Main Topics  . Smoking status: Never Smoker  . Smokeless tobacco: Never Used  . Alcohol use No  . Drug use: No  . Sexual activity: Not on file   Other Topics Concern  . Not on file   Social History Narrative   Lives in [place] alone.  Divorced.   Retired Primary school teacher     BP 118/80   Pulse 67   Ht 6' (1.829 m)   Wt 183 lb 1.9 oz (83.1 kg)   BMI 24.84 kg/m   Physical Exam:  Well appearing 65 year old man, NAD HEENT: Unremarkable Neck:  No JVD, no thyromegally Back:  No CVA tenderness Lungs:  Clear with no wheezes, rales, or rhonchi. HEART:  Regular rate rhythm, no murmurs, no rubs, no clicks Abd:  soft, positive bowel sounds, no organomegally, no rebound, no guarding Ext:  2 plus pulses, no edema, no cyanosis, no clubbing Skin:  No rashes no nodules Neuro:  CN II through XII intact, motor grossly intact  EKG - Today - 04/03/16 - NSR 67 BPM no other abnormalities. 02/18/16 - SVT AVNRT like 155 bpm. 03/29/14 - NSR, NSSTW changes. No change  from prior.   ER notes reviewed, lab work reviewed, EKG reviewed  Assessment/Plan:  1. SVT-he has been evaluated by electrophysiology, last by Jeffrey Jimenez. Ablation was offered however patient wished to wait. We had lengthy discussion today about ablative therapy. Encouraged him to discuss this further with Jeffrey Jimenez. His daughter was here present for discussion. He is willing to try Cardizem CD 120 once a day. If his blood pressure decreases to much, dizziness may occur. He will let us know. He is very active. Hopefully, he will go forward with procedure in the future. He just isn't ready. Once again discussed that since he has had positive troponin in the past, demand ischemia but this may put him at increased risk cardiovascular standpoint.  2. 6 month follow up.

## 2016-05-03 DIAGNOSIS — Z23 Encounter for immunization: Secondary | ICD-10-CM | POA: Diagnosis not present

## 2016-05-03 DIAGNOSIS — Z125 Encounter for screening for malignant neoplasm of prostate: Secondary | ICD-10-CM | POA: Diagnosis not present

## 2016-05-03 DIAGNOSIS — Z0000 Encounter for general adult medical examination without abnormal findings: Secondary | ICD-10-CM | POA: Diagnosis not present

## 2016-05-03 DIAGNOSIS — Z131 Encounter for screening for diabetes mellitus: Secondary | ICD-10-CM | POA: Diagnosis not present

## 2016-05-03 DIAGNOSIS — I471 Supraventricular tachycardia: Secondary | ICD-10-CM | POA: Diagnosis not present

## 2016-05-03 DIAGNOSIS — Z1389 Encounter for screening for other disorder: Secondary | ICD-10-CM | POA: Diagnosis not present

## 2016-05-03 DIAGNOSIS — R7309 Other abnormal glucose: Secondary | ICD-10-CM | POA: Diagnosis not present

## 2016-05-16 ENCOUNTER — Other Ambulatory Visit: Payer: Self-pay | Admitting: *Deleted

## 2016-05-16 MED ORDER — DILTIAZEM HCL ER COATED BEADS 120 MG PO CP24: 120.0000 mg | ORAL_CAPSULE | Freq: Every day | ORAL | 3 refills | Status: DC

## 2016-05-16 MED ORDER — DILTIAZEM HCL 90 MG PO TABS: 90.0000 mg | ORAL_TABLET | ORAL | 99 refills | Status: DC | PRN

## 2016-05-16 NOTE — Telephone Encounter (Signed)
Patient called to get medications sent to a different [facility]. He also wanted it changed to 90 pick ups.

## 2016-07-25 DIAGNOSIS — H524 Presbyopia: Secondary | ICD-10-CM | POA: Diagnosis not present

## 2016-07-25 DIAGNOSIS — H43813 Vitreous degeneration, bilateral: Secondary | ICD-10-CM | POA: Diagnosis not present

## 2016-07-25 DIAGNOSIS — H02831 Dermatochalasis of right upper eyelid: Secondary | ICD-10-CM | POA: Diagnosis not present

## 2016-07-25 DIAGNOSIS — H2513 Age-related nuclear cataract, bilateral: Secondary | ICD-10-CM | POA: Diagnosis not present

## 2016-08-01 ENCOUNTER — Telehealth: Payer: Self-pay | Admitting: Cardiology

## 2016-08-01 NOTE — Telephone Encounter (Signed)
New Message:    Pt is scheduled for cataract surgery on 09-13-16. Please call,pt have some questions.

## 2016-08-01 NOTE — Telephone Encounter (Signed)
New Message:    Pt is going to have cataract surgery on  09-13-16. Please call,he says he have some questions.

## 2016-08-01 NOTE — Telephone Encounter (Signed)
Duplicate Name

## 2016-08-01 NOTE — Telephone Encounter (Signed)
I spoke with pt who is calling to let Dr Marlou Porch know he is scheduled for cataract surgery.  He has not been told to hold medications prior to procedure but he thinks surgical center is going to ask him to hold medications prior to procedure.  I told him to check with surgeon to see if he needs to hold any medications prior to procedure and to have surgeon's office contact our office if approval from Dr. Marlou Porch to hold medicine needed.

## 2016-08-05 NOTE — Telephone Encounter (Signed)
No need to hold any meds for cataract surgery. May proceed Candee Furbish, MD

## 2016-09-13 DIAGNOSIS — H2511 Age-related nuclear cataract, right eye: Secondary | ICD-10-CM | POA: Diagnosis not present

## 2016-09-13 DIAGNOSIS — H25011 Cortical age-related cataract, right eye: Secondary | ICD-10-CM | POA: Diagnosis not present

## 2016-09-13 DIAGNOSIS — H25811 Combined forms of age-related cataract, right eye: Secondary | ICD-10-CM | POA: Diagnosis not present

## 2016-10-02 ENCOUNTER — Encounter (INDEPENDENT_AMBULATORY_CARE_PROVIDER_SITE_OTHER): Payer: Self-pay

## 2016-10-02 ENCOUNTER — Encounter: Payer: Self-pay | Admitting: Cardiology

## 2016-10-02 ENCOUNTER — Ambulatory Visit (INDEPENDENT_AMBULATORY_CARE_PROVIDER_SITE_OTHER): Payer: PPO | Admitting: Cardiology

## 2016-10-02 VITALS — BP 110/74 | HR 64 | Ht 72.0 in | Wt 191.8 lb

## 2016-10-02 DIAGNOSIS — I471 Supraventricular tachycardia: Secondary | ICD-10-CM | POA: Diagnosis not present

## 2016-10-02 DIAGNOSIS — I248 Other forms of acute ischemic heart disease: Secondary | ICD-10-CM | POA: Diagnosis not present

## 2016-10-02 NOTE — Progress Notes (Signed)
Cardiology Office Note    Date:  10/02/2016   ID:  Jeffrey Jimenez, DOB 12/16/1950, MRN [id number]  PCP:  Simona Huh, MD  Cardiologist:   Candee Furbish, MD     History of Present Illness:  Jeffrey Jimenez is a 66 y.o. male here for follow up PSVT. Has had demand ischemia with these episodes. Has refused ablation (Dr. Lovena Le). ECHO in 2010 normal EF, mild MR, NUC in 2010 no ischemia. Symptoms are dizziness. Not able to terminate with vagal maneuvers. No preexcitation on ECG. On Cardizem 120 once a day.  Overall has not had any episodes since taking the Cardizem. This is excellent. No chest pain, no shortness of breath. He has had some excessive sweating that he is noted. He is not noticing any side effects of the diltiazem such as constipation or ankle edema.  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    Past Medical History:  Diagnosis Date  . Chest pain    Nuc stress test 05/31/09 showed no evidence of ischemia, normal EF & no significant changes from previous study in 07/26/05. ECHO 05/31/09 showed normal EF & mild MR.  . Diverticulosis    Found by colonoscopy in 2010  . Hx of colonic polyps    colonoscopy in 2007  . IBS (irritable bowel syndrome)   . Paroxysmal supraventricular tachycardia (HCC)    Diltiazem PRN. Nuc stress test 05/31/09 showed no evidence of ischemia, normal EF & no significant changes from previous study in 07/26/05. ECHO 05/31/09 showed normal EF & mild MR.  . Rosacea   . SVT (supraventricular tachycardia) (HCC)     Past Surgical History:  Procedure Laterality Date  . COLONOSCOPY  2007 & 2010   Hx of colon polyps & diverticulosis  . TONSILLECTOMY AND ADENOIDECTOMY  1957    Current Medications: Outpatient Medications Prior to Visit  Medication Sig Dispense Refill  . aspirin 81 MG tablet Take 81 mg by mouth daily.    Marland Kitchen diltiazem (CARDIZEM CD) 120 MG 24 hr capsule Take 1 capsule  (120 mg total) by mouth daily. 90 capsule 3  . diltiazem (CARDIZEM) 90 MG tablet Take 1 tablet (90 mg total) by mouth as needed (for episodes of rapid heart rate). 30 tablet prn   No facility-administered medications prior to visit.      Allergies:   Patient has no known allergies.   Social History   Social History  . Marital status: Divorced    Spouse name: N/A  . Number of children: N/A  . Years of education: N/A   Social History Main Topics  . Smoking status: Never Smoker  . Smokeless tobacco: Never Used  . Alcohol use No  . Drug use: No  . Sexual activity: Not Asked   Other Topics Concern  . None   Social History Narrative   Lives in [place] alone.  Divorced.   Retired Primary school teacher     Family History:  The patient's family history includes Heart attack in his father; Liver cancer in his mother; Pancreatic cancer in his mother.   ROS:   Please see the history of present illness.    ROS All other systems reviewed and are negative.   PHYSICAL EXAM:   VS:  BP 110/74   Pulse 64   Ht 6' (1.829 m)   Wt 191 lb 12.8 oz (87 kg)   SpO2 97%   BMI 26.01 kg/m  GEN: Well nourished, well developed, in no acute distress  HEENT: normal  Neck: no JVD, carotid bruits, or masses Cardiac: RRR; no murmurs, rubs, or gallops,no edema  Respiratory:  clear to auscultation bilaterally, normal work of breathing GI: soft, nontender, nondistended, + BS MS: no deformity or atrophy  Skin: warm and dry, no rash Neuro:  Alert and Oriented x 3, Strength and sensation are intact Psych: euthymic mood, full affect  Wt Readings from Last 3 Encounters:  10/02/16 191 lb 12.8 oz (87 kg)  04/03/16 183 lb 1.9 oz (83.1 kg)  02/18/16 186 lb (84.4 kg)      Studies/Labs Reviewed:   EKG:  EKG is ordered today.  The ekg ordered today demonstrates 10/02/16-sinus bradycardia rate 58 with no other abnormalities. Personally viewed  Recent Labs: 02/18/2016: BUN 14; Creatinine, Ser  1.11; Hemoglobin 13.2; Platelets 228; Potassium 3.5; Sodium 135   Lipid Panel No results found for: CHOL, TRIG, HDL, CHOLHDL, VLDL, LDLCALC, LDLDIRECT  Additional studies/ records that were reviewed today include:  Prior ECG, labs, ECHO, NUC, hospital records.     ASSESSMENT:    1. SVT (supraventricular tachycardia) ([place])   2. Demand ischemia ([place])      PLAN:  In order of problems listed above:  PSVT  - EP has offered ablation after consultation. Patient wants to wait. This now seems reasonable since the diltiazem is helping to keep the PSVT at rest.  - Seems to be doing well with Cardizem long-acting 120 mg once a day in prevention. We discussed the physiology behind this.  Demand ischemia  - prior SVT associated with elevated troponin.   - places him at increased risk at the time of the prior incident.  - Prior nuclear stress test with no ischemia.    Medication Adjustments/Labs and Tests Ordered: Current medicines are reviewed at length with the patient today.  Concerns regarding medicines are outlined above.  Medication changes, Labs and Tests ordered today are listed in the Patient Instructions below. Patient Instructions  Medication Instructions:  The current medical regimen is effective;  continue present plan and medications.  Follow-Up: Follow up in 1 year with Dr. Marlou Porch.  You will receive a letter in the mail 2 months before you are due.  Please call us when you receive this letter to schedule your follow up appointment.  If you need a refill on your cardiac medications before your next appointment, please call your pharmacy.  Thank you for choosing [facility]!!        Signed, Candee Furbish, MD  10/02/2016 9:00 AM    [address], [place], [place]  [address] Phone: [phone]; Fax: [phone]

## 2016-10-02 NOTE — Patient Instructions (Signed)

## 2017-01-09 DIAGNOSIS — H43812 Vitreous degeneration, left eye: Secondary | ICD-10-CM | POA: Diagnosis not present

## 2017-01-09 DIAGNOSIS — H2512 Age-related nuclear cataract, left eye: Secondary | ICD-10-CM | POA: Diagnosis not present

## 2017-01-09 DIAGNOSIS — H25012 Cortical age-related cataract, left eye: Secondary | ICD-10-CM | POA: Diagnosis not present

## 2017-01-22 DIAGNOSIS — L570 Actinic keratosis: Secondary | ICD-10-CM | POA: Diagnosis not present

## 2017-01-31 DIAGNOSIS — H25812 Combined forms of age-related cataract, left eye: Secondary | ICD-10-CM | POA: Diagnosis not present

## 2017-01-31 DIAGNOSIS — H2512 Age-related nuclear cataract, left eye: Secondary | ICD-10-CM | POA: Diagnosis not present

## 2017-01-31 DIAGNOSIS — H25012 Cortical age-related cataract, left eye: Secondary | ICD-10-CM | POA: Diagnosis not present

## 2017-04-03 ENCOUNTER — Other Ambulatory Visit: Payer: Self-pay | Admitting: Cardiology

## 2017-05-27 DIAGNOSIS — Z1389 Encounter for screening for other disorder: Secondary | ICD-10-CM | POA: Diagnosis not present

## 2017-05-27 DIAGNOSIS — Z1159 Encounter for screening for other viral diseases: Secondary | ICD-10-CM | POA: Diagnosis not present

## 2017-05-27 DIAGNOSIS — Z131 Encounter for screening for diabetes mellitus: Secondary | ICD-10-CM | POA: Diagnosis not present

## 2017-05-27 DIAGNOSIS — Z125 Encounter for screening for malignant neoplasm of prostate: Secondary | ICD-10-CM | POA: Diagnosis not present

## 2017-05-27 DIAGNOSIS — Z0000 Encounter for general adult medical examination without abnormal findings: Secondary | ICD-10-CM | POA: Diagnosis not present

## 2017-05-27 DIAGNOSIS — I471 Supraventricular tachycardia: Secondary | ICD-10-CM | POA: Diagnosis not present

## 2017-05-27 DIAGNOSIS — Z23 Encounter for immunization: Secondary | ICD-10-CM | POA: Diagnosis not present

## 2017-05-27 DIAGNOSIS — L570 Actinic keratosis: Secondary | ICD-10-CM | POA: Diagnosis not present

## 2017-05-30 ENCOUNTER — Other Ambulatory Visit: Payer: Self-pay | Admitting: *Deleted

## 2017-05-30 MED ORDER — DILTIAZEM HCL 90 MG PO TABS: 90.0000 mg | ORAL_TABLET | ORAL | 3 refills | Status: DC | PRN

## 2017-06-03 DIAGNOSIS — M545 Low back pain: Secondary | ICD-10-CM | POA: Diagnosis not present

## 2017-10-03 ENCOUNTER — Ambulatory Visit: Payer: PPO | Admitting: Cardiology

## 2017-10-14 ENCOUNTER — Ambulatory Visit: Payer: PPO | Admitting: Cardiology

## 2017-11-07 ENCOUNTER — Encounter: Payer: Self-pay | Admitting: Cardiology

## 2017-11-07 ENCOUNTER — Ambulatory Visit: Payer: PPO | Admitting: Cardiology

## 2017-11-07 VITALS — BP 108/72 | HR 66 | Ht 72.0 in | Wt 187.8 lb

## 2017-11-07 DIAGNOSIS — I471 Supraventricular tachycardia: Secondary | ICD-10-CM

## 2017-11-07 DIAGNOSIS — L814 Other melanin hyperpigmentation: Secondary | ICD-10-CM | POA: Diagnosis not present

## 2017-11-07 DIAGNOSIS — L578 Other skin changes due to chronic exposure to nonionizing radiation: Secondary | ICD-10-CM | POA: Diagnosis not present

## 2017-11-07 DIAGNOSIS — B351 Tinea unguium: Secondary | ICD-10-CM | POA: Diagnosis not present

## 2017-11-07 DIAGNOSIS — L718 Other rosacea: Secondary | ICD-10-CM | POA: Diagnosis not present

## 2017-11-07 DIAGNOSIS — R202 Paresthesia of skin: Secondary | ICD-10-CM | POA: Diagnosis not present

## 2017-11-07 DIAGNOSIS — B353 Tinea pedis: Secondary | ICD-10-CM | POA: Diagnosis not present

## 2017-11-07 DIAGNOSIS — D1801 Hemangioma of skin and subcutaneous tissue: Secondary | ICD-10-CM | POA: Diagnosis not present

## 2017-11-07 DIAGNOSIS — D225 Melanocytic nevi of trunk: Secondary | ICD-10-CM | POA: Diagnosis not present

## 2017-11-07 NOTE — Progress Notes (Signed)
Cardiology Office Note    Date:  11/07/2017   ID:  Jeffrey Jimenez, DOB 09/30/1950, MRN [id number]  PCP:  Jeffrey Arabian, MD  Cardiologist:   Jeffrey Furbish, MD     History of Present Illness:  Jeffrey Jimenez is a 67 y.o. male here for follow up PSVT. Has had demand ischemia with these episodes. Has refused ablation (Dr. Lovena Jimenez). ECHO in 2010 normal EF, mild MR, NUC in 2010 no ischemia. Symptoms are dizziness. Not able to terminate with vagal maneuvers. No preexcitation on ECG. On Cardizem 120 once a day.  Overall has not had any episodes since taking the Cardizem. This is excellent. No chest pain, no shortness of breath. He has had some excessive sweating that he is noted. He is not noticing any side effects of the diltiazem such as constipation or ankle edema.  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   11/07/17-still has not had any further episodes of PSVT since being on the Cardizem CD 120 mg once a day.  He has a 90 Cardizem just in case.  He has been to [place], [place] in [place] travel.  Contemplating going back to [place] for a cruise.  Denies any chest pain syncope bleeding orthopnea PND  Past Medical History:  Diagnosis Date  . Chest pain    Nuc stress test 05/31/09 showed no evidence of ischemia, normal EF & no significant changes from previous study in 07/26/05. ECHO 05/31/09 showed normal EF & mild MR.  . Diverticulosis    Found by colonoscopy in 2010  . Hx of colonic polyps    colonoscopy in 2007  . IBS (irritable bowel syndrome)   . Paroxysmal supraventricular tachycardia (HCC)    Diltiazem PRN. Nuc stress test 05/31/09 showed no evidence of ischemia, normal EF & no significant changes from previous study in 07/26/05. ECHO 05/31/09 showed normal EF & mild MR.  . Rosacea   . SVT (supraventricular tachycardia) (HCC)     Past Surgical History:  Procedure Laterality Date  .  COLONOSCOPY  2007 & 2010   Hx of colon polyps & diverticulosis  . TONSILLECTOMY AND ADENOIDECTOMY  1957    Current Medications: Outpatient Medications Prior to Visit  Medication Sig Dispense Refill  . aspirin 81 MG tablet Take 81 mg by mouth daily.    Marland Kitchen diltiazem (CARDIZEM CD) 120 MG 24 hr capsule TAKE ONE CAPSULE BY MOUTH DAILY 90 capsule 1  . diltiazem (CARDIZEM) 90 MG tablet Take 1 tablet (90 mg total) by mouth as needed (for episodes of rapid heart rate). 30 tablet 3   No facility-administered medications prior to visit.      Allergies:   Patient has no known allergies.   Social History   Socioeconomic History  . Marital status: Divorced    Spouse name: Not on file  . Number of children: Not on file  . Years of education: Not on file  . Highest education level: Not on file  Occupational History  . Not on file  Social Needs  . Financial resource strain: Not on file  . Food insecurity:    Worry: Not on file    Inability: Not on file  . Transportation needs:    Medical: Not on file    Non-medical: Not on file  Tobacco Use  . Smoking status: Never Smoker  . Smokeless tobacco: Never Used  Substance and Sexual Activity  .  Alcohol use: No  . Drug use: No  . Sexual activity: Not on file  Lifestyle  . Physical activity:    Days per week: Not on file    Minutes per session: Not on file  . Stress: Not on file  Relationships  . Social connections:    Talks on phone: Not on file    Gets together: Not on file    Attends religious service: Not on file    Active member of club or organization: Not on file    Attends meetings of clubs or organizations: Not on file    Relationship status: Not on file  Other Topics Concern  . Not on file  Social History Narrative   Lives in [place] alone.  Divorced.   Retired Primary school teacher     Family History:  The patient's family history includes Heart attack in his father; Liver cancer in his mother; Pancreatic cancer  in his mother.   ROS:   Please see the history of present illness.    Review of Systems  All other systems reviewed and are negative.     PHYSICAL EXAM:   VS:  BP 108/72   Pulse 66   Ht 6' (1.829 m)   Wt 187 lb 12.8 oz (85.2 kg)   BMI 25.47 kg/m    GEN: Well nourished, well developed, in no acute distress  HEENT: normal  Neck: no JVD, carotid bruits, or masses Cardiac: RRR; no murmurs, rubs, or gallops,no edema  Respiratory:  clear to auscultation bilaterally, normal work of breathing GI: soft, nontender, nondistended, + BS MS: no deformity or atrophy  Skin: warm and dry, no rash Neuro:  Alert and Oriented x 3, Strength and sensation are intact Psych: euthymic mood, full affect   Wt Readings from Last 3 Encounters:  11/07/17 187 lb 12.8 oz (85.2 kg)  10/02/16 191 lb 12.8 oz (87 kg)  04/03/16 183 lb 1.9 oz (83.1 kg)      Studies/Labs Reviewed:   EKG:  EKG is ordered today.  The ekg ordered today demonstrates 11/07/17-sinus rhythm 66 with no other abnormality except for nonspecific T wave changes aVF.  10/02/16-sinus bradycardia rate 58 with no other abnormalities. Personally viewed  Recent Labs: No results found for requested labs within last 8760 hours.   Lipid Panel No results found for: CHOL, TRIG, HDL, CHOLHDL, VLDL, LDLCALC, LDLDIRECT  Additional studies/ records that were reviewed today include:  Prior ECG, labs, ECHO, NUC, hospital records.     ASSESSMENT:    1. SVT (supraventricular tachycardia) (HCC)      PLAN:  In order of problems listed above:  PSVT  - EP, Dr. Lovena Jimenez has offered ablation after consultation.  He has not been having any episodes recently so no need for ablation at this point.  - Seems to be doing well with Cardizem long-acting 120 mg once a day in prevention. We discussed the physiology behind this.  Continue.  Demand ischemia  - prior SVT associated with elevated troponin.   - places him at increased risk at the time of the  prior incident.  - Prior nuclear stress test with no ischemia.  Continue to monitor.    Medication Adjustments/Labs and Tests Ordered: Current medicines are reviewed at length with the patient today.  Concerns regarding medicines are outlined above.  Medication changes, Labs and Tests ordered today are listed in the Patient Instructions below. Patient Instructions  Medication Instructions:  The current medical regimen is effective;  continue  present plan and medications.  Follow-Up: Follow up in 1 year with Dr. Marlou Porch.  You will receive a letter in the mail 2 months before you are due.  Please call us when you receive this letter to schedule your follow up appointment.  If you need a refill on your cardiac medications before your next appointment, please call your pharmacy.  Thank you for choosing [facility]!!        Signed, Jeffrey Furbish, MD  11/07/2017 2:39 PM    [address], [place], [place]  [address] Phone: [phone]; Fax: [phone]

## 2017-11-07 NOTE — Patient Instructions (Signed)

## 2017-11-13 DIAGNOSIS — M722 Plantar fascial fibromatosis: Secondary | ICD-10-CM | POA: Diagnosis not present

## 2017-12-11 ENCOUNTER — Other Ambulatory Visit: Payer: Self-pay | Admitting: Cardiology

## 2017-12-19 DIAGNOSIS — L570 Actinic keratosis: Secondary | ICD-10-CM | POA: Diagnosis not present

## 2017-12-25 ENCOUNTER — Encounter (HOSPITAL_BASED_OUTPATIENT_CLINIC_OR_DEPARTMENT_OTHER): Payer: Self-pay

## 2017-12-25 ENCOUNTER — Emergency Department (HOSPITAL_BASED_OUTPATIENT_CLINIC_OR_DEPARTMENT_OTHER)
Admission: EM | Admit: 2017-12-25 | Discharge: 2017-12-25 | Disposition: A | Discharge status: Home / Self Care | Payer: PPO | Attending: Emergency Medicine | Admitting: Emergency Medicine

## 2017-12-25 DIAGNOSIS — I499 Cardiac arrhythmia, unspecified: Secondary | ICD-10-CM | POA: Diagnosis present

## 2017-12-25 DIAGNOSIS — I471 Supraventricular tachycardia: Secondary | ICD-10-CM | POA: Insufficient documentation

## 2017-12-25 DIAGNOSIS — Z7982 Long term (current) use of aspirin: Secondary | ICD-10-CM | POA: Diagnosis not present

## 2017-12-25 DIAGNOSIS — Z79899 Other long term (current) drug therapy: Secondary | ICD-10-CM | POA: Insufficient documentation

## 2017-12-25 LAB — BASIC METABOLIC PANEL
Anion gap: 10 (ref 5–15)
BUN: 15 mg/dL (ref 6–20)
CO2: 24 mmol/L (ref 22–32)
Calcium: 8.9 mg/dL (ref 8.9–10.3)
Chloride: 102 mmol/L (ref 101–111)
Creatinine, Ser: 1.3 mg/dL — ABNORMAL HIGH (ref 0.61–1.24)
GFR calc Af Amer: >60 mL/min (ref >60)
GFR calc non Af Amer: 54 mL/min — ABNORMAL LOW (ref >60)
Glucose, Bld: 114 mg/dL — ABNORMAL HIGH (ref 65–99)
Potassium: 3.9 mmol/L (ref 3.5–5.1)
Sodium: 136 mmol/L (ref 135–145)

## 2017-12-25 LAB — MAGNESIUM: Magnesium: 2 mg/dL (ref 1.7–2.4)

## 2017-12-25 MED ORDER — ADENOSINE 6 MG/2ML IV SOLN
INTRAVENOUS | Status: AC | PRN
  Administered 2017-12-25: 12 mg via INTRAVENOUS
  Administered 2017-12-25: 6 mg via INTRAVENOUS

## 2017-12-25 MED ORDER — SODIUM CHLORIDE 0.9 % IV SOLN
INTRAVENOUS | Status: AC | PRN
  Administered 2017-12-25 (×2): 1000 mL via INTRAVENOUS

## 2017-12-25 MED ORDER — ADENOSINE 6 MG/2ML IV SOLN
INTRAVENOUS | Status: AC
  Filled 2017-12-25: qty 6

## 2017-12-25 NOTE — ED Notes (Signed)
States took Cardizem 90mg  at 10:05 with no relief

## 2017-12-25 NOTE — ED Provider Notes (Addendum)
[facility] EMERGENCY DEPARTMENT Provider Note   CSN: [id number] Arrival date & time: 12/25/17  1102     History   Chief Complaint Chief Complaint  Patient presents with  . Irregular Heart Beat    HPI Jeffrey Jimenez is a 67 y.o. male.  HPI  67yM with palpitations while playing pickle ball earlier this morning. Hx of SVT. Current symptoms feel similar. He reports compliance with cardizem CD and also took a 90 mg tablet which he uses on PRN basis. No CP or dyspnea although has felt a little lightheaded. Was in his usual state of health when he woke up.   Past Medical History:  Diagnosis Date  . Chest pain    Nuc stress test 05/31/09 showed no evidence of ischemia, normal EF & no significant changes from previous study in 07/26/05. ECHO 05/31/09 showed normal EF & mild MR.  . Diverticulosis    Found by colonoscopy in 2010  . Hx of colonic polyps    colonoscopy in 2007  . IBS (irritable bowel syndrome)   . Paroxysmal supraventricular tachycardia (HCC)    Diltiazem PRN. Nuc stress test 05/31/09 showed no evidence of ischemia, normal EF & no significant changes from previous study in 07/26/05. ECHO 05/31/09 showed normal EF & mild MR.  . Rosacea   . SVT (supraventricular tachycardia) [facility])     Patient Active Problem List   Diagnosis Date Noted  . Demand ischemia ([place]) 10/02/2016  . SVT (supraventricular tachycardia) ([place]) 12/22/2012    Past Surgical History:  Procedure Laterality Date  . COLONOSCOPY  2007 & 2010   Hx of colon polyps & diverticulosis  . TONSILLECTOMY AND ADENOIDECTOMY  1957        Home Medications    Prior to Admission medications   Medication Sig Start Date End Date Taking? Authorizing Provider  aspirin 81 MG tablet Take 81 mg by mouth daily.    [provider]  diltiazem (CARDIZEM CD) 120 MG 24 hr capsule TAKE ONE CAPSULE BY MOUTH DAILY 12/11/17   Jerline Pain, MD  diltiazem (CARDIZEM) 90 MG tablet Take 1 tablet (90 mg  total) by mouth as needed (for episodes of rapid heart rate). 05/30/17   Jerline Pain, MD    Family History Family History  Problem Relation Age of Onset  . Pancreatic cancer Mother   . Liver cancer Mother   . Heart attack Father     Social History Social History   Tobacco Use  . Smoking status: Never Smoker  . Smokeless tobacco: Never Used  Substance Use Topics  . Alcohol use: No  . Drug use: No     Allergies   Patient has no known allergies.   Review of Systems Review of Systems  All systems reviewed and negative, other than as noted in HPI.  Physical Exam Updated Vital Signs BP 100/71   Pulse (!) 114   Temp 98.4 F (36.9 C)   Resp 12   Ht 6' (1.829 m)   Wt 81.6 kg (180 lb)   SpO2 100%   BMI 24.41 kg/m   Physical Exam  Constitutional: He appears well-developed and well-nourished. No distress.  HENT:  Head: Normocephalic and atraumatic.  Eyes: Conjunctivae are normal. Right eye exhibits no discharge. Left eye exhibits no discharge.  Neck: Neck supple.  Cardiovascular: Regular rhythm and normal heart sounds. Exam reveals no gallop and no friction rub.  No murmur heard. Very tachycardic. Seems regular.   Pulmonary/Chest: Effort normal  and breath sounds normal. No respiratory distress.  Abdominal: Soft. He exhibits no distension. There is no tenderness.  Musculoskeletal: He exhibits no edema or tenderness.  Neurological: He is alert.  Skin: Skin is warm and dry.  Psychiatric: He has a normal mood and affect. His behavior is normal. Thought content normal.  Nursing note and vitals reviewed.    ED Treatments / Results  Labs (all labs ordered are listed, but only abnormal results are displayed) Labs Reviewed  BASIC METABOLIC PANEL - Abnormal; Notable for the following components:      Result Value   Glucose, Bld 114 (*)    Creatinine, Ser 1.30 (*)    GFR calc non Af Amer 54 (*)    All other components within normal limits  MAGNESIUM     EKG EKG Interpretation  Date/Time:  Wednesday Dec 25 2017 11:41:10 EDT Ventricular Rate:  83 PR Interval:    QRS Duration: 97 QT Interval:  391 QTC Calculation: 460 R Axis:   60 Text Interpretation:  Sinus rhythm Confirmed by Virgel Manifold [phone]) on 12/25/2017 11:56:45 AM   Radiology No results found.  Procedures  CRITICAL CARE Performed by: Virgel Manifold Total critical care time: 35 minutes Critical care time was exclusive of separately billable procedures and treating other patients. Critical care was necessary to treat or prevent imminent or life-threatening deterioration. Critical care was time spent personally by me on the following activities: development of treatment plan with patient and/or surrogate as well as nursing, discussions with consultants, evaluation of patient's response to treatment, examination of patient, obtaining history from patient or surrogate, ordering and performing treatments and interventions, ordering and review of laboratory studies, ordering and review of radiographic studies, pulse oximetry and re-evaluation of patient's condition.   .Cardioversion Date/Time: 12/25/2017 11:30 AM Performed by: Virgel Manifold, MD Authorized by: Virgel Manifold, MD   Consent:    Consent obtained:  Verbal and emergent situation   Consent given by:  Patient   Risks discussed:  Induced arrhythmia   Alternatives discussed:  Rate-control medication Pre-procedure details:    Cardioversion basis:  Emergent   Rhythm:  Supraventricular tachycardia Attempt one:    Cardioversion mode attempt one: Chemical cardioversion. adenosine 6mg  x1 w/o affect and then converted with 12mg  adenosine x1.  Post-procedure details:    Patient status:  Alert   Patient tolerance of procedure:  Tolerated well, no immediate complications   (including critical care time)  Medications Ordered in ED Medications  adenosine (ADENOCARD) 6 MG/2ML injection (has no administration in time  range)  adenosine (ADENOCARD) 6 MG/2ML injection (12 mg Intravenous Given 12/25/17 1132)     Initial Impression / Assessment and Plan / ED Course  I have reviewed the triage vital signs and the nursing notes.  Pertinent labs & imaging results that were available during my care of the patient were reviewed by me and considered in my medical decision making (see chart for details).     67yM with SVT. Hx of the same. Tried vagal maneuvers w/o success. Hypotensive but alert and didn't seem distressed. Deferred from DCCV in preference of adenosine and he converted with 12 mg.   No complaints after conversion to sinus rhythm. Will check electrolytes. Supplement if needed. Has been evaluated by cardiology previously and declined ablation. Last event was about two years ago. Since fairly well controlled on cardizem, I have no strong impetus to change meds/dosing. Will have him follow-up with cardiology.   Final Clinical Impressions(s) / ED Diagnoses  Final diagnoses:  SVT (supraventricular tachycardia) [facility])    ED Discharge Orders    None       Virgel Manifold, MD 12/25/17 3710    Virgel Manifold, MD 01/03/18 1750

## 2017-12-25 NOTE — ED Triage Notes (Signed)
Pt c/o SVT episode 10a while playing pickle ball; states hx of same 18yrs ago and converted on his on. Denies chest pain or SOB

## 2017-12-25 NOTE — ED Notes (Signed)
NAD at this time. Pt is stable and going home.  

## 2017-12-25 NOTE — ED Notes (Signed)
Pt is in SVT.  Attempted bearing down x 3 without results,  Dr. Wilson Singer notified.

## 2017-12-25 NOTE — ED Notes (Signed)
Pt states he is feeling much better.  NSR on monitor.  Pt had taken an extra cardizem dose 90mg  today.  BP soft, IVF infusing

## 2018-01-02 ENCOUNTER — Telehealth: Payer: Self-pay | Admitting: Cardiology

## 2018-01-02 NOTE — Telephone Encounter (Signed)
New Message    Patient called to advise that he had a SVT episode last Thursday. He did go to the emergency room on 05/22.  He wants to discuss this further. Please call.

## 2018-01-02 NOTE — Telephone Encounter (Signed)
Spoke with patient who reports a recent ED visit for SVT where he was treated with adenosine and converted.  He would like to make Dr Marlou Porch aware of this and he also requests an appointment with Dr Marlou Porch to f/u.  Advised I will forward this information to Dr Marlou Porch and scheduled for 7/19 (next available).

## 2018-01-08 ENCOUNTER — Emergency Department (HOSPITAL_BASED_OUTPATIENT_CLINIC_OR_DEPARTMENT_OTHER): Payer: PPO

## 2018-01-08 ENCOUNTER — Encounter (HOSPITAL_BASED_OUTPATIENT_CLINIC_OR_DEPARTMENT_OTHER): Payer: Self-pay | Admitting: Emergency Medicine

## 2018-01-08 ENCOUNTER — Other Ambulatory Visit: Payer: Self-pay

## 2018-01-08 ENCOUNTER — Emergency Department (HOSPITAL_BASED_OUTPATIENT_CLINIC_OR_DEPARTMENT_OTHER)
Admission: EM | Admit: 2018-01-08 | Discharge: 2018-01-08 | Disposition: A | Discharge status: Home / Self Care | Payer: PPO | Attending: Emergency Medicine | Admitting: Emergency Medicine

## 2018-01-08 DIAGNOSIS — M25512 Pain in left shoulder: Secondary | ICD-10-CM

## 2018-01-08 DIAGNOSIS — Z7982 Long term (current) use of aspirin: Secondary | ICD-10-CM | POA: Insufficient documentation

## 2018-01-08 DIAGNOSIS — R0789 Other chest pain: Secondary | ICD-10-CM

## 2018-01-08 DIAGNOSIS — R079 Chest pain, unspecified: Secondary | ICD-10-CM | POA: Diagnosis not present

## 2018-01-08 LAB — BASIC METABOLIC PANEL
ANION GAP: 6 (ref 5–15)
BUN: 12 mg/dL (ref 6–20)
CHLORIDE: 107 mmol/L (ref 101–111)
CO2: 28 mmol/L (ref 22–32)
Calcium: 8.6 mg/dL — ABNORMAL LOW (ref 8.9–10.3)
Creatinine, Ser: 0.81 mg/dL (ref 0.61–1.24)
GFR calc Af Amer: >60 mL/min (ref >60)
GFR calc non Af Amer: >60 mL/min (ref >60)
GLUCOSE: 95 mg/dL (ref 65–99)
POTASSIUM: 4.2 mmol/L (ref 3.5–5.1)
Sodium: 141 mmol/L (ref 135–145)

## 2018-01-08 LAB — CBC WITH DIFFERENTIAL/PLATELET
BASOS ABS: 0 10*3/uL (ref 0.0–0.1)
Basophils Relative: 1 %
Eosinophils Absolute: 0.2 10*3/uL (ref 0.0–0.7)
Eosinophils Relative: 3 %
HEMATOCRIT: 38.2 % — AB (ref 39.0–52.0)
HEMOGLOBIN: 13.7 g/dL (ref 13.0–17.0)
LYMPHS PCT: 29 %
Lymphs Abs: 2 10*3/uL (ref 0.7–4.0)
MCH: 31.8 pg (ref 26.0–34.0)
MCHC: 35.9 g/dL (ref 30.0–36.0)
MCV: 88.6 fL (ref 78.0–100.0)
MONO ABS: 0.8 10*3/uL (ref 0.1–1.0)
Monocytes Relative: 12 %
NEUTROS ABS: 3.8 10*3/uL (ref 1.7–7.7)
NEUTROS PCT: 55 %
Platelets: 166 10*3/uL (ref 150–400)
RBC: 4.31 MIL/uL (ref 4.22–5.81)
RDW: 12.8 % (ref 11.5–15.5)
WBC: 6.8 10*3/uL (ref 4.0–10.5)

## 2018-01-08 LAB — TROPONIN I: Troponin I: <0.03 ng/mL (ref <0.03)

## 2018-01-08 NOTE — ED Provider Notes (Addendum)
[facility] EMERGENCY DEPARTMENT Provider Note   CSN: [id number] Arrival date & time: 01/08/18  0251     History   Chief Complaint Chief Complaint  Patient presents with  . Shoulder Pain    HPI Jeffrey Jimenez is a 67 y.o. male.  The history is provided by the patient. No language interpreter was used.  Shoulder Pain      Jeffrey Jimenez is a 67 y.o. male who presents to the Emergency Department complaining of shoulder pain. He reports intermittent left anterior shoulder pain (just inferior to the left [facility] joint) since episode of SVT on May 22. Pain episodes wax and wane with no clear alleviating or worsening factors. Each episode lasts a few seconds to a few minutes. He endorses feeling more fatigued and generally unwell over the last two weeks. He denies any fevers, cough, shortness of breath, nausea, diaphoresis, leg swelling or pain. He does exercise daily and has had no change in his exercise performance. The pain in his shoulder does not change with exercises or range of motion. His last stress test was in 2010. He has a family history of cardiac disease and this father at 75. He has a history of SVT but no additional medical problems. He is a non-smoker. No history of DVT or PE.  Past Medical History:  Diagnosis Date  . Chest pain    Nuc stress test 05/31/09 showed no evidence of ischemia, normal EF & no significant changes from previous study in 07/26/05. ECHO 05/31/09 showed normal EF & mild MR.  . Diverticulosis    Found by colonoscopy in 2010  . Hx of colonic polyps    colonoscopy in 2007  . IBS (irritable bowel syndrome)   . Paroxysmal supraventricular tachycardia (HCC)    Diltiazem PRN. Nuc stress test 05/31/09 showed no evidence of ischemia, normal EF & no significant changes from previous study in 07/26/05. ECHO 05/31/09 showed normal EF & mild MR.  . Rosacea   . SVT (supraventricular tachycardia) [facility])     Patient Active Problem List   Diagnosis  Date Noted  . Demand ischemia ([place]) 10/02/2016  . SVT (supraventricular tachycardia) ([place]) 12/22/2012    Past Surgical History:  Procedure Laterality Date  . COLONOSCOPY  2007 & 2010   Hx of colon polyps & diverticulosis  . TONSILLECTOMY AND ADENOIDECTOMY  1957        Home Medications    Prior to Admission medications   Medication Sig Start Date End Date Taking? Authorizing Provider  aspirin 81 MG tablet Take 81 mg by mouth daily.    [provider]  diltiazem (CARDIZEM CD) 120 MG 24 hr capsule TAKE ONE CAPSULE BY MOUTH DAILY 12/11/17   Jerline Pain, MD  diltiazem (CARDIZEM) 90 MG tablet Take 1 tablet (90 mg total) by mouth as needed (for episodes of rapid heart rate). 05/30/17   Jerline Pain, MD    Family History Family History  Problem Relation Age of Onset  . Pancreatic cancer Mother   . Liver cancer Mother   . Heart attack Father     Social History Social History   Tobacco Use  . Smoking status: Never Smoker  . Smokeless tobacco: Never Used  Substance Use Topics  . Alcohol use: No  . Drug use: No     Allergies   Patient has no known allergies.   Review of Systems Review of Systems  All other systems reviewed and are negative.  Physical Exam Updated Vital Signs BP 109/78   Pulse 62   Temp 98.2 F (36.8 C) (Oral)   Resp 13   Ht 6' (1.829 m)   Wt 83.9 kg (185 lb)   SpO2 100%   BMI 25.09 kg/m   Physical Exam  Constitutional: He is oriented to person, place, and time. He appears well-developed and well-nourished.  HENT:  Head: Normocephalic and atraumatic.  Cardiovascular: Normal rate and regular rhythm.  No murmur heard. Pulmonary/Chest: Effort normal and breath sounds normal. No respiratory distress.  Abdominal: Soft. There is no tenderness. There is no rebound and no guarding.  Musculoskeletal: He exhibits no edema or tenderness.  2+ radial pulses bilaterally. There is no tenderness throughout the left shoulder. Range of  motion is intact. No calf tenderness bilaterally.   Neurological: He is alert and oriented to person, place, and time.  Skin: Skin is warm and dry.  Psychiatric: He has a normal mood and affect. His behavior is normal.  Nursing note and vitals reviewed.    ED Treatments / Results  Labs (all labs ordered are listed, but only abnormal results are displayed) Labs Reviewed  BASIC METABOLIC PANEL - Abnormal; Notable for the following components:      Result Value   Calcium 8.6 (*)    All other components within normal limits  CBC WITH DIFFERENTIAL/PLATELET - Abnormal; Notable for the following components:   HCT 38.2 (*)    All other components within normal limits  TROPONIN I    EKG EKG Interpretation  Date/Time:  Wednesday January 08 2018 03:05:58 EDT Ventricular Rate:  71 PR Interval:  172 QRS Duration: 94 QT Interval:  406 QTC Calculation: 441 R Axis:   48 Text Interpretation:  Normal sinus rhythm Normal ECG No significant change was found Confirmed by Shanon Rosser [id number]) on 01/08/2018 3:33:57 AM   Radiology Dg Chest 2 View  Result Date: 01/08/2018 CLINICAL DATA:  Chest and left shoulder region pain EXAM: CHEST - 2 VIEW COMPARISON:  May 27, 2009 FINDINGS: Lungs are clear. Heart size and pulmonary vascularity are normal. No adenopathy. No pneumothorax. There is mild degenerative change in the thoracic spine. IMPRESSION: No edema or consolidation. Electronically Signed   By: Lowella Grip III M.D.   On: 01/08/2018 07:47    Procedures Procedures (including critical care time)  Medications Ordered in ED Medications - No data to display   Initial Impression / Assessment and Plan / ED Course  I have reviewed the triage vital signs and the nursing notes.  Pertinent labs & imaging results that were available during my care of the patient were reviewed by me and considered in my medical decision making (see chart for details).     Pt with hx/o SVT here for evaluation of  intermittent anterior left shoulder pain since last SVT episode. He is well appearing on evaluation and in no acute distress. EKG without acute ischemic changes. No evidence of arrhythmia during his ED observation. Current presentation is not consistent with ACS, PE, dissection, CHF, pneumonia, septic arthritis, gouty arthritis, acute rotator cuff injury or AC separation. Discussed with patient home care for chest pain/shoulder pain. Recommend decreasing his upper body workout and weight routine with close cardiology follow-up. Return precautions discussed. He does take a baby aspirin daily, recommend continuing this.  Final Clinical Impressions(s) / ED Diagnoses   Final diagnoses:  Acute pain of left shoulder  Atypical chest pain    ED Discharge Orders    None  Quintella Reichert, MD 01/08/18 [id number]    Quintella Reichert, MD 01/08/18 [phone]

## 2018-01-08 NOTE — ED Notes (Signed)
NAD at this time. Pt is stable and going home.  

## 2018-01-08 NOTE — ED Triage Notes (Signed)
"   My left shoulder has been hurting me for the past 2 days and I just don't feel right " Denies recent injury to left shoulder but played "tickle ball" yesterday

## 2018-01-13 ENCOUNTER — Encounter: Payer: Self-pay | Admitting: Physician Assistant

## 2018-01-13 ENCOUNTER — Ambulatory Visit: Payer: PPO | Admitting: Physician Assistant

## 2018-01-13 VITALS — BP 110/66 | HR 66 | Ht 71.0 in | Wt 180.0 lb

## 2018-01-13 DIAGNOSIS — R0789 Other chest pain: Secondary | ICD-10-CM | POA: Diagnosis not present

## 2018-01-13 DIAGNOSIS — I471 Supraventricular tachycardia: Secondary | ICD-10-CM

## 2018-01-13 LAB — TSH: TSH: 1.13 u[IU]/mL (ref 0.450–4.500)

## 2018-01-13 NOTE — Progress Notes (Signed)
Cardiology Office Note:    Date:  01/13/2018   ID:  Jeffrey Jimenez, DOB 18-Mar-1951, MRN [id number]  PCP:  Gaynelle Arabian, MD  Cardiologist:  Candee Furbish, MD   Referring MD: Gaynelle Arabian, MD   Chief Complaint  Patient presents with  . Hospitalization Follow-up    ED visit for SVT and chest pain    History of Present Illness:    Jeffrey Jimenez is a 67 y.o. male with paroxysmal supraventricular tachycardia.  He has had evidence of demand ischemia with SVT in the past.  He has declined ablation in the past with EP.  He has been managed on calcium channel blocker therapy.  Prior notes indicate he had a Nuclear stress test in 2010 that was negative for ischemia.  He was last seen by Dr. Marlou Porch April 2019.  He was then seen in the ED with SVT (HR 170s).  He converted to NSR after Adenosine (6 mg >> 12 mg).  He was then seen in the emergency room 01/08/2018 with L shoulder pain.  ECG was negative for acute changes.  Troponin was also normal.  Chest x-ray was unremarkable.     Jeffrey Jimenez Is here alone today.  He has not had any recurrent episodes of SVT since he went to the emergency room.  He does work out every day and plays [employer].  He lifts weights.  He wonders if his L chest pain is related to weight lifting.  He denies any significant shortness of breath. He denies paroxysmal nocturnal dyspnea, edema, syncope.  His last episode of SVT was in 2017.  Prior CV studies:   The following studies were reviewed today:  Echo 05/31/09 EF 55-60, no RWMA, mild MR, trivial TR, Gr 1 DD  Past Medical History:  Diagnosis Date  . Chest pain    Nuc stress test 05/31/09 showed no evidence of ischemia, normal EF & no significant changes from previous study in 07/26/05. ECHO 05/31/09 showed normal EF & mild MR.  . Diverticulosis    Found by colonoscopy in 2010  . Hx of colonic polyps    colonoscopy in 2007  . IBS (irritable bowel syndrome)   . Paroxysmal supraventricular tachycardia  (HCC)    Diltiazem PRN. Nuc stress test 05/31/09 showed no evidence of ischemia, normal EF & no significant changes from previous study in 07/26/05. ECHO 05/31/09 showed normal EF & mild MR.  . Rosacea   . SVT (supraventricular tachycardia) ([place])    Surgical Hx: The patient  has a past surgical history that includes Tonsillectomy and adenoidectomy (7824) and Colonoscopy (2007 & 2010).   Current Medications: Current Meds  Medication Sig  . aspirin 81 MG tablet Take 81 mg by mouth daily.  Marland Kitchen diltiazem (CARDIZEM CD) 120 MG 24 hr capsule TAKE ONE CAPSULE BY MOUTH DAILY  . diltiazem (CARDIZEM) 90 MG tablet Take 1 tablet (90 mg total) by mouth as needed (for episodes of rapid heart rate).     Allergies:   Patient has no known allergies.   Social History   Tobacco Use  . Smoking status: Never Smoker  . Smokeless tobacco: Never Used  Substance Use Topics  . Alcohol use: No  . Drug use: No     Family Hx: The patient's family history includes Heart attack in his father; Liver cancer in his mother; Pancreatic cancer in his mother.  ROS:   Please see the history of present illness.    ROS All other systems reviewed  and are negative.   EKGs/Labs/Other Test Reviewed:    EKG:  EKG is  ordered today.  The ekg ordered today demonstrates normal sinus rhythm, heart rate 66, normal axis, nonspecific ST-T wave changes, QTC 419  Recent Labs: 12/25/2017: Magnesium 2.0 01/08/2018: BUN 12; Creatinine, Ser 0.81; Hemoglobin 13.7; Platelets 166; Potassium 4.2; Sodium 141   Recent Lipid Panel No results found for: CHOL, TRIG, HDL, CHOLHDL, LDLCALC, LDLDIRECT  Physical Exam:    VS:  BP 110/66   Pulse 66   Ht 5\' 11"  (1.803 m)   Wt 180 lb (81.6 kg)   SpO2 95%   BMI 25.10 kg/m     Wt Readings from Last 3 Encounters:  01/13/18 180 lb (81.6 kg)  01/08/18 185 lb (83.9 kg)  12/25/17 180 lb (81.6 kg)     Physical Exam  Constitutional: He is oriented to person, place, and time. He appears  well-developed and well-nourished. No distress.  HENT:  Head: Normocephalic and atraumatic.  Neck: Neck supple. No JVD present.  Cardiovascular: Normal rate, regular rhythm, S1 normal and S2 normal.  No murmur heard. Pulmonary/Chest: Breath sounds normal. He has no rales.  Abdominal: Soft. There is no hepatomegaly.  Musculoskeletal: He exhibits no edema.  Neurological: He is alert and oriented to person, place, and time.  Skin: Skin is warm and dry.    ASSESSMENT & PLAN:    SVT (supraventricular tachycardia) ([place]) He has had an episode of SVT 7 or 8 times since 2010.  He usually has about 12 months in between each episode.  He has gone a couple of years without an episode as well.  He brought in a journal article from the [facility] from 2002 for Dr. Marlou Porch to review.  Jeffrey Jimenez is considering whether or not he should go for radiofrequency catheter ablation.  I reviewed the article briefly with him today.  The article does address other types of SVT such as atrial fibrillation and atrial flutter as well as atrial tachycardia.  I reviewed his ECG from the emergency room.  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  He would like to hold off on being referred back to Dr. Lovena Le until after he sees Dr. Marlou Porch in July.  -Continue Cardizem  -Obtain TSH today  Other chest pain His left chest pain is atypical.  It may be musculoskeletal from his workouts.  He has not had a stress test in 9 years.  His ECG is unchanged.  I have suggested he undergo an exercise tolerance test to rule out ischemia.  -Arrange GXT   Dispo:  Return in about 6 weeks (around 02/21/2018) for Scheduled Follow Up w/ Dr. Marlou Porch.   Medication Adjustments/Labs and Tests Ordered: Current medicines are reviewed at length with the patient today.  Concerns regarding medicines are outlined above.  Tests Ordered: Orders Placed This Encounter  Procedures  . TSH  . Exercise  Tolerance Test  . EKG 12-Lead   Medication Changes: No orders of the defined types were placed in this encounter.   Signed, Richardson Dopp, PA-C  01/13/2018 4:15 PM    [facility] [place], [place], [place]  [address] Phone: [phone]; Fax: [phone]

## 2018-01-13 NOTE — Patient Instructions (Signed)
Medication Instructions:  1. Your physician recommends that you continue on your current medications as directed. Please refer to the Current Medication list given to you today.   Labwork: TODAY TSH  Testing/Procedures: Your physician has requested that you have an exercise tolerance test. For further information please visit [facility]. Please also follow instruction sheet, as given.    Follow-Up: KEEP YOUR APPT WITH DR. Marlou Porch 02/21/18 @ 3 PM   Any Other Special Instructions Will Be Listed Below (If Applicable).     If you need a refill on your cardiac medications before your next appointment, please call your pharmacy.

## 2018-01-14 ENCOUNTER — Telehealth: Payer: Self-pay | Admitting: *Deleted

## 2018-01-14 NOTE — Telephone Encounter (Signed)
DPR ok to lmom. Left message lab work normal. Continue on current Tx plan. If any questions call [phone]. I will forward results to PCP as well.

## 2018-01-14 NOTE — Telephone Encounter (Signed)
-----   Message from Liliane Shi, [place] sent at 01/13/2018  7:45 PM EDT ----- TSH is normal.  Continue current medications and follow up as planned.  Richardson Dopp, PA-C    01/13/2018 7:45 PM

## 2018-01-16 ENCOUNTER — Ambulatory Visit (INDEPENDENT_AMBULATORY_CARE_PROVIDER_SITE_OTHER): Payer: PPO

## 2018-01-16 DIAGNOSIS — R0789 Other chest pain: Secondary | ICD-10-CM | POA: Diagnosis not present

## 2018-01-17 LAB — EXERCISE TOLERANCE TEST
CHL CUP MPHR: 151 {beats}/min
CSEPED: 8 min
CSEPHR: 102 %
Estimated workload: 10.1 METS
Exercise duration (sec): 0 s
Peak HR: 155 {beats}/min
RPE: 16
Rest HR: 67 {beats}/min

## 2018-01-20 ENCOUNTER — Encounter: Payer: Self-pay | Admitting: Physician Assistant

## 2018-02-03 DIAGNOSIS — W57XXXA Bitten or stung by nonvenomous insect and other nonvenomous arthropods, initial encounter: Secondary | ICD-10-CM | POA: Diagnosis not present

## 2018-02-03 DIAGNOSIS — S40862A Insect bite (nonvenomous) of left upper arm, initial encounter: Secondary | ICD-10-CM | POA: Diagnosis not present

## 2018-02-03 DIAGNOSIS — R238 Other skin changes: Secondary | ICD-10-CM | POA: Diagnosis not present

## 2018-02-21 ENCOUNTER — Encounter (INDEPENDENT_AMBULATORY_CARE_PROVIDER_SITE_OTHER): Payer: Self-pay

## 2018-02-21 ENCOUNTER — Ambulatory Visit (INDEPENDENT_AMBULATORY_CARE_PROVIDER_SITE_OTHER): Payer: PPO | Admitting: Cardiology

## 2018-02-21 ENCOUNTER — Encounter: Payer: Self-pay | Admitting: Cardiology

## 2018-02-21 VITALS — BP 92/58 | HR 77 | Ht 71.0 in | Wt 180.0 lb

## 2018-02-21 DIAGNOSIS — I248 Other forms of acute ischemic heart disease: Secondary | ICD-10-CM

## 2018-02-21 DIAGNOSIS — I471 Supraventricular tachycardia: Secondary | ICD-10-CM

## 2018-02-21 NOTE — Patient Instructions (Signed)
Medication Instructions:  The current medical regimen is effective;  continue present plan and medications.  Follow-Up: Follow up in 1 year with Dr. Marlou Porch.  You will receive a letter in the mail 2 months before you are due.  Please call us when you receive this letter to schedule your follow up appointment.  You are being referred back to Dr Lovena Le to discuss ablation.  If you need a refill on your cardiac medications before your next appointment, please call your pharmacy.  Thank you for choosing [facility]!!

## 2018-02-21 NOTE — Progress Notes (Signed)
Cardiology Office Note    Date:  02/21/2018   ID:  Jeffrey Jimenez, DOB [birth year], MRN [id number]  PCP:  Gaynelle Arabian, MD  Cardiologist:   Candee Furbish, MD     History of Present Illness:  Jeffrey Jimenez is a 67 y.o. male here for follow up PSVT. Has had demand ischemia with these episodes. Has refused ablation (Dr. Lovena Le). ECHO in 2010 normal EF, mild MR, NUC in 2010 no ischemia. Symptoms are dizziness. Not able to terminate with vagal maneuvers. No preexcitation on ECG. On Cardizem 120 once a day.  Overall has not had any episodes since taking the Cardizem. This is excellent. No chest pain, no shortness of breath. He has had some excessive sweating that he is noted. He is not noticing any side effects of the diltiazem such as constipation or ankle edema.  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   11/07/17-still has not had any further episodes of PSVT since being on the Cardizem CD 120 mg once a day.  He has a 90 Cardizem just in case.  He has been to [place], [place] in [place] travel.  Contemplating going back to [place] for a cruise.  Denies any chest pain syncope bleeding orthopnea PND  02/21/18 - playing pickle ball felt PSVT. Before at rest. Could not get rid of it. Adenosine 6 x 2 had to be used. 174bpm. Looks like AVNRT (retrograde P).  He is done extensive reading about this.  Symptomatic.  Nervous about possible ablation but he feels that he is convincing himself after reading the article on the success rates.  No bleeding syncope orthopnea PND.  Past Medical History:  Diagnosis Date  . Chest pain    Nuc stress test 05/31/09 showed no evidence of ischemia, normal EF & no significant changes from previous study in 07/26/05. ECHO 05/31/09 showed normal EF & mild MR.  . Diverticulosis    Found by colonoscopy in 2010  . History of exercise stress test    GXT 6/19:  Normal   . Hx of  colonic polyps    colonoscopy in 2007  . IBS (irritable bowel syndrome)   . Paroxysmal supraventricular tachycardia (HCC)    Diltiazem PRN. Nuc stress test 05/31/09 showed no evidence of ischemia, normal EF & no significant changes from previous study in 07/26/05. ECHO 05/31/09 showed normal EF & mild MR.  . Rosacea   . SVT (supraventricular tachycardia) (HCC)     Past Surgical History:  Procedure Laterality Date  . COLONOSCOPY  2007 & 2010   Hx of colon polyps & diverticulosis  . TONSILLECTOMY AND ADENOIDECTOMY  1957    Current Medications: Outpatient Medications Prior to Visit  Medication Sig Dispense Refill  . aspirin 81 MG tablet Take 81 mg by mouth daily.    Marland Kitchen diltiazem (CARDIZEM CD) 120 MG 24 hr capsule TAKE ONE CAPSULE BY MOUTH DAILY 90 capsule 3  . diltiazem (CARDIZEM) 90 MG tablet Take 1 tablet (90 mg total) by mouth as needed (for episodes of rapid heart rate). 30 tablet 3  . hydrocortisone 2.5 % cream Apply 1 application topically as needed for rash.     No facility-administered medications prior to visit.      Allergies:   Patient has no known allergies.   Social History   Socioeconomic History  . Marital status: Divorced    Spouse name: Not on file  .  Number of children: Not on file  . Years of education: Not on file  . Highest education level: Not on file  Occupational History  . Not on file  Social Needs  . Financial resource strain: Not on file  . Food insecurity:    Worry: Not on file    Inability: Not on file  . Transportation needs:    Medical: Not on file    Non-medical: Not on file  Tobacco Use  . Smoking status: Never Smoker  . Smokeless tobacco: Never Used  Substance and Sexual Activity  . Alcohol use: No  . Drug use: No  . Sexual activity: Not on file  Lifestyle  . Physical activity:    Days per week: Not on file    Minutes per session: Not on file  . Stress: Not on file  Relationships  . Social connections:    Talks on phone: Not  on file    Gets together: Not on file    Attends religious service: Not on file    Active member of club or organization: Not on file    Attends meetings of clubs or organizations: Not on file    Relationship status: Not on file  Other Topics Concern  . Not on file  Social History Narrative   Lives in [place] alone.  Divorced.   Retired Primary school teacher     Family History:  The patient's family history includes Heart attack in his father; Liver cancer in his mother; Pancreatic cancer in his mother.   ROS:   Please see the history of present illness.    Review of Systems  All other systems reviewed and are negative.     PHYSICAL EXAM:   VS:  BP (!) 92/58   Pulse 77   Ht 5\' 11"  (1.803 m)   Wt 180 lb (81.6 kg)   SpO2 94%   BMI 25.10 kg/m    GEN: Well nourished, well developed, in no acute distress  HEENT: normal  Neck: no JVD, carotid bruits, or masses Cardiac: RRR; no murmurs, rubs, or gallops,no edema  Respiratory:  clear to auscultation bilaterally, normal work of breathing GI: soft, nontender, nondistended, + BS MS: no deformity or atrophy  Skin: warm and dry, no rash Neuro:  Alert and Oriented x 3, Strength and sensation are intact Psych: euthymic mood, full affect     Wt Readings from Last 3 Encounters:  02/21/18 180 lb (81.6 kg)  01/13/18 180 lb (81.6 kg)  01/08/18 185 lb (83.9 kg)      Studies/Labs Reviewed:   EKG: May 2019 -narrow complex tachycardia with retrograde P waves likely AVNRT heart rate 174 bpm.  11/07/17-sinus rhythm 66 with no other abnormality except for nonspecific T wave changes aVF.  10/02/16-sinus bradycardia rate 58 with no other abnormalities. Personally viewed  Recent Labs: 12/25/2017: Magnesium 2.0 01/08/2018: BUN 12; Creatinine, Ser 0.81; Hemoglobin 13.7; Platelets 166; Potassium 4.2; Sodium 141 01/13/2018: TSH 1.130   Lipid Panel No results found for: CHOL, TRIG, HDL, CHOLHDL, VLDL, LDLCALC, LDLDIRECT  Additional  studies/ records that were reviewed today include:  Prior ECG, labs, ECHO, NUC, hospital records.     ASSESSMENT:    1. SVT (supraventricular tachycardia) ([place])   2. Demand ischemia ([place])      PLAN:  In order of problems listed above:  PSVT  - EP, Dr. Lovena Le consultation.  He is studied extensively about SVT ablation.  He has an article summarizing with him today.  We  discussed at length.  He is learning about this because he is trying to convince himself that this is the right therapy for him.  I encouraged him to move forward with this especially since he is now had another episode in 2019, May that required adenosine.  I will have him talk with Dr. Lovena Le again.  I promote that he moved forward.  - Continue Cardizem long-acting 120 mg once a day in prevention.  Demand ischemia  - prior SVT associated with elevated troponin.   - Prior nuclear stress test with no ischemia.  Continue to monitor.    Medication Adjustments/Labs and Tests Ordered: Current medicines are reviewed at length with the patient today.  Concerns regarding medicines are outlined above.  Medication changes, Labs and Tests ordered today are listed in the Patient Instructions below. Patient Instructions  Medication Instructions:  The current medical regimen is effective;  continue present plan and medications.  Follow-Up: Follow up in 1 year with Dr. Marlou Porch.  You will receive a letter in the mail 2 months before you are due.  Please call us when you receive this letter to schedule your follow up appointment.  You are being referred back to Dr Lovena Le to discuss ablation.  If you need a refill on your cardiac medications before your next appointment, please call your pharmacy.  Thank you for choosing [facility]!!        Signed, Candee Furbish, MD  02/21/2018 3:37 PM    [facility] [place], [place]  [address] Phone: [phone]; Fax: [phone]

## 2018-04-29 ENCOUNTER — Encounter: Payer: Self-pay | Admitting: Internal Medicine

## 2018-05-06 ENCOUNTER — Ambulatory Visit: Payer: PPO | Admitting: Internal Medicine

## 2018-05-14 ENCOUNTER — Ambulatory Visit: Payer: PPO | Admitting: Internal Medicine

## 2018-05-14 ENCOUNTER — Encounter (INDEPENDENT_AMBULATORY_CARE_PROVIDER_SITE_OTHER): Payer: Self-pay

## 2018-05-14 ENCOUNTER — Encounter: Payer: Self-pay | Admitting: Internal Medicine

## 2018-05-14 VITALS — BP 118/72 | HR 65 | Ht 71.0 in | Wt 179.0 lb

## 2018-05-14 DIAGNOSIS — I471 Supraventricular tachycardia: Secondary | ICD-10-CM

## 2018-05-14 NOTE — Patient Instructions (Addendum)
Medication Instructions:  Your physician recommends that you continue on your current medications as directed. Please refer to the Current Medication list given to you today.  Labwork: None ordered.  Testing/Procedures: Your physician has recommended that you have an ablation. Catheter ablation is a medical procedure used to treat some cardiac arrhythmias (irregular heartbeats). During catheter ablation, a long, thin, flexible tube is put into a blood vessel in your groin (upper thigh), or neck. This tube is called an ablation catheter. It is then guided to your heart through the blood vessel. Radio frequency waves destroy small areas of heart tissue where abnormal heartbeats may cause an arrhythmia to start. Please see the instruction sheet given to you today.  Follow-Up:  October 10, 24, 25, 28 and 29 November 4, 7, 15, 18, 19, 25, 26  If you decide to go ahead with the procedure please call me: Myrtie Hawk, RN with Dr. Lovena Le [phone]    Cardiac Ablation Cardiac ablation is a procedure to disable (ablate) a small amount of heart tissue in very specific places. The heart has many electrical connections. Sometimes these connections are abnormal and can cause the heart to beat very fast or irregularly. Ablating some of the problem areas can improve the heart rhythm or return it to normal. Ablation may be done for people who:  Have Wolff-Parkinson-White syndrome.  Have fast heart rhythms (tachycardia).  Have taken medicines for an abnormal heart rhythm (arrhythmia) that were not effective or caused side effects.  Have a high-risk heartbeat that may be life-threatening.  During the procedure, a small incision is made in the neck or the groin, and a long, thin, flexible tube (catheter) is inserted into the incision and moved to the heart. Small devices (electrodes) on the tip of the catheter will send out electrical currents. A type of X-ray (fluoroscopy) will be used to help guide the  catheter and to provide images of the heart. Tell a health care provider about:  Any allergies you have.  All medicines you are taking, including vitamins, herbs, eye drops, creams, and over-the-counter medicines.  Any problems you or family members have had with anesthetic medicines.  Any blood disorders you have.  Any surgeries you have had.  Any medical conditions you have, such as kidney failure.  Whether you are pregnant or may be pregnant. What are the risks? Generally, this is a safe procedure. However, problems may occur, including:  Infection.  Bruising and bleeding at the catheter insertion site.  Bleeding into the chest, especially into the sac that surrounds the heart. This is a serious complication.  Stroke or blood clots.  Damage to other structures or organs.  Allergic reaction to medicines or dyes.  Need for a permanent pacemaker if the normal electrical system is damaged. A pacemaker is a small computer that sends electrical signals to the heart and helps your heart beat normally.  The procedure not being fully effective. This may not be recognized until months later. Repeat ablation procedures are sometimes required.  What happens before the procedure?  Follow instructions from your health care provider about eating or drinking restrictions.  Ask your health care provider about: ? Changing or stopping your regular medicines. This is especially important if you are taking diabetes medicines or blood thinners. ? Taking medicines such as aspirin and ibuprofen. These medicines can thin your blood. Do not take these medicines before your procedure if your health care provider instructs you not to.  Plan to have someone take you  home from the hospital or clinic.  If you will be going home right after the procedure, plan to have someone with you for 24 hours. What happens during the procedure?  To lower your risk of infection: ? Your health care team will  wash or sanitize their hands. ? Your skin will be washed with soap. ? Hair may be removed from the incision area.  An IV tube will be inserted into one of your veins.  You will be given a medicine to help you relax (sedative).  The skin on your neck or groin will be numbed.  An incision will be made in your neck or your groin.  A needle will be inserted through the incision and into a large vein in your neck or groin.  A catheter will be inserted into the needle and moved to your heart.  Dye may be injected through the catheter to help your surgeon see the area of the heart that needs treatment.  Electrical currents will be sent from the catheter to ablate heart tissue in desired areas. There are three types of energy that may be used to ablate heart tissue: ? Heat (radiofrequency energy). ? Laser energy. ? Extreme cold (cryoablation).  When the necessary tissue has been ablated, the catheter will be removed.  Pressure will be held on the catheter insertion area to prevent excessive bleeding.  A bandage (dressing) will be placed over the catheter insertion area. The procedure may vary among health care providers and hospitals. What happens after the procedure?  Your blood pressure, heart rate, breathing rate, and blood oxygen level will be monitored until the medicines you were given have worn off.  Your catheter insertion area will be monitored for bleeding. You will need to lie still for a few hours to ensure that you do not bleed from the catheter insertion area.  Do not drive for 24 hours or as long as directed by your health care provider. Summary  Cardiac ablation is a procedure to disable (ablate) a small amount of heart tissue in very specific places. Ablating some of the problem areas can improve the heart rhythm or return it to normal.  During the procedure, electrical currents will be sent from the catheter to ablate heart tissue in desired areas. This information  is not intended to replace advice given to you by your health care provider. Make sure you discuss any questions you have with your health care provider. Document Released: 12/09/2008 Document Revised: 06/11/2016 Document Reviewed: 06/11/2016 Elsevier Interactive Patient Education  [employer].

## 2018-05-14 NOTE — Progress Notes (Signed)
HPI Jeffrey Jimenez returns today for followup after an over 4 year absence from our arrhythmia clinic. He is a very pleasant 67 year old man who has a history of SVT and hospital visits for SVT dating back to 2010. He was last in the hospital in May when he was in SVT at 175/min. He was given IV adenosine with termination. He has had one prolonged episode of SVT on medical therapy.  No Known Allergies   Current Outpatient Medications  Medication Sig Dispense Refill  . aspirin 81 MG tablet Take 81 mg by mouth daily.    Marland Kitchen diltiazem (CARDIZEM CD) 120 MG 24 hr capsule TAKE ONE CAPSULE BY MOUTH DAILY 90 capsule 3  . diltiazem (CARDIZEM) 90 MG tablet Take 1 tablet (90 mg total) by mouth as needed (for episodes of rapid heart rate). 30 tablet 3  . hydrocortisone 2.5 % cream Apply 1 application topically as needed for rash.     No current facility-administered medications for this visit.      Past Medical History:  Diagnosis Date  . Chest pain    Nuc stress test 05/31/09 showed no evidence of ischemia, normal EF & no significant changes from previous study in 07/26/05. ECHO 05/31/09 showed normal EF & mild MR.  . Diverticulosis    Found by colonoscopy in 2010  . History of exercise stress test    GXT 6/19:  Normal   . Hx of colonic polyps    colonoscopy in 2007  . IBS (irritable bowel syndrome)   . Paroxysmal supraventricular tachycardia (HCC)    Diltiazem PRN. Nuc stress test 05/31/09 showed no evidence of ischemia, normal EF & no significant changes from previous study in 07/26/05. ECHO 05/31/09 showed normal EF & mild MR.  . Rosacea   . SVT (supraventricular tachycardia) (HCC)     ROS:   All systems reviewed and negative except as noted in the HPI.   Past Surgical History:  Procedure Laterality Date  . COLONOSCOPY  2007 & 2010   Hx of colon polyps & diverticulosis  . TONSILLECTOMY AND ADENOIDECTOMY  1957     Family History  Problem Relation Age of Onset  .  Pancreatic cancer Mother   . Liver cancer Mother   . Heart attack Father      Social History   Socioeconomic History  . Marital status: Divorced    Spouse name: Not on file  . Number of children: Not on file  . Years of education: Not on file  . Highest education level: Not on file  Occupational History  . Not on file  Social Needs  . Financial resource strain: Not on file  . Food insecurity:    Worry: Not on file    Inability: Not on file  . Transportation needs:    Medical: Not on file    Non-medical: Not on file  Tobacco Use  . Smoking status: Never Smoker  . Smokeless tobacco: Never Used  Substance and Sexual Activity  . Alcohol use: No  . Drug use: No  . Sexual activity: Not on file  Lifestyle  . Physical activity:    Days per week: Not on file    Minutes per session: Not on file  . Stress: Not on file  Relationships  . Social connections:    Talks on phone: Not on file    Gets together: Not on file    Attends religious service: Not on file    Active member  of club or organization: Not on file    Attends meetings of clubs or organizations: Not on file    Relationship status: Not on file  . Intimate partner violence:    Fear of current or ex partner: Not on file    Emotionally abused: Not on file    Physically abused: Not on file    Forced sexual activity: Not on file  Other Topics Concern  . Not on file  Social History Narrative   Lives in [place] alone.  Divorced.   Retired Primary school teacher     BP 118/72   Pulse 65   Ht 5\' 11"  (1.803 m)   Wt 179 lb (81.2 kg)   SpO2 97%   BMI 24.97 kg/m   Physical Exam:  Well appearing NAD HEENT: Unremarkable Neck:  No JVD, no thyromegally Lymphatics:  No adenopathy Back:  No CVA tenderness Lungs:  Clear with no wheezes HEART:  Regular rate rhythm, no murmurs, no rubs, no clicks Abd:  soft, positive bowel sounds, no organomegally, no rebound, no guarding Ext:  2 plus pulses, no edema, no  cyanosis, no clubbing Skin:  No rashes no nodules Neuro:  CN II through XII intact, motor grossly intact  EKG - nsr  Assess/Plan: 1. SVT - I have again discussed in detail the indications/risks/benefits/goals/expectations of EP study and catheter ablation of SVT and he will call us if he wishes to proceed with ablation. He has many questions. I spent 60 minutes including over 50% face to face time producing this note  Keyosha Tiedt,M.D.

## 2018-05-18 DIAGNOSIS — R300 Dysuria: Secondary | ICD-10-CM | POA: Diagnosis not present

## 2018-05-18 DIAGNOSIS — R39198 Other difficulties with micturition: Secondary | ICD-10-CM | POA: Diagnosis not present

## 2018-05-18 DIAGNOSIS — R809 Proteinuria, unspecified: Secondary | ICD-10-CM | POA: Diagnosis not present

## 2018-05-19 ENCOUNTER — Telehealth: Payer: Self-pay | Admitting: Internal Medicine

## 2018-05-19 DIAGNOSIS — I471 Supraventricular tachycardia: Secondary | ICD-10-CM

## 2018-05-19 NOTE — Telephone Encounter (Signed)
  Patient wants to speak with you regarding a procedure with Dr. Lovena Le.  Patient declined further details.

## 2018-05-20 NOTE — Telephone Encounter (Signed)
Returned call to Pt.  Pt would like to schedule SVT ablation on June 23, 2018 at 9:30 am.  Will schedule for labs on June 16, 2018.  Work up complete.

## 2018-05-28 DIAGNOSIS — R3915 Urgency of urination: Secondary | ICD-10-CM | POA: Diagnosis not present

## 2018-05-28 DIAGNOSIS — Z131 Encounter for screening for diabetes mellitus: Secondary | ICD-10-CM | POA: Diagnosis not present

## 2018-05-28 DIAGNOSIS — Z23 Encounter for immunization: Secondary | ICD-10-CM | POA: Diagnosis not present

## 2018-05-28 DIAGNOSIS — Z0000 Encounter for general adult medical examination without abnormal findings: Secondary | ICD-10-CM | POA: Diagnosis not present

## 2018-05-28 DIAGNOSIS — I471 Supraventricular tachycardia: Secondary | ICD-10-CM | POA: Diagnosis not present

## 2018-05-28 DIAGNOSIS — Z1389 Encounter for screening for other disorder: Secondary | ICD-10-CM | POA: Diagnosis not present

## 2018-05-28 DIAGNOSIS — Z125 Encounter for screening for malignant neoplasm of prostate: Secondary | ICD-10-CM | POA: Diagnosis not present

## 2018-05-28 DIAGNOSIS — Z136 Encounter for screening for cardiovascular disorders: Secondary | ICD-10-CM | POA: Diagnosis not present

## 2018-05-30 ENCOUNTER — Telehealth: Payer: Self-pay | Admitting: Internal Medicine

## 2018-05-30 NOTE — Telephone Encounter (Signed)
Call back received from Pt.  Pt wants [place] to know he is currently being treated for a UTI with a second antibiotic.  He plans on still getting lab work prior to procedure 06/16/2018 at this office.  Then he has follow up with his urologist on 06/19/2018.  Notified Pt if he was healed at that point no further contact needed.  If Pt was still struggling with an infection he should notify this nurse on that day for further advisement.  Pt indicates understanding.

## 2018-05-30 NOTE — Telephone Encounter (Signed)
° ° °  Patient has pre procedure questions

## 2018-05-30 NOTE — Telephone Encounter (Signed)
Left message.  Await further needs.

## 2018-06-12 ENCOUNTER — Telehealth: Payer: Self-pay | Admitting: Internal Medicine

## 2018-06-12 NOTE — Telephone Encounter (Signed)
New Message   Patient is calling in reference appt he has for labs on 11/11. He says he wants to discuss the lab. Please call.

## 2018-06-12 NOTE — Telephone Encounter (Signed)
Patient is calling to speak with Sonia Baller in regards to lab work 11/11 to be drawn prior to ablation 11/18.  He said that he talked to her in the past because he may need to cancel both.  Please call.

## 2018-06-13 ENCOUNTER — Telehealth: Payer: Self-pay

## 2018-06-13 NOTE — Telephone Encounter (Signed)
Left message for Pt to return this nurse call. 

## 2018-06-13 NOTE — Telephone Encounter (Signed)
Returned call to Pt.  Pt concerned about lab work on Monday 06/16/2018.  Advised he could come in anytime that day.  He can also call me any time that day if he thinks he is not feeling well yet and wants to cancel procedure.  Provided reassurance.

## 2018-06-13 NOTE — Telephone Encounter (Signed)
Error

## 2018-06-13 NOTE — Telephone Encounter (Signed)
Open call dealling with this issue.  Will close this as duplicate.  See phone note from 05/30/2018 for further details.

## 2018-06-16 ENCOUNTER — Other Ambulatory Visit: Payer: PPO | Admitting: *Deleted

## 2018-06-16 DIAGNOSIS — I471 Supraventricular tachycardia: Secondary | ICD-10-CM | POA: Diagnosis not present

## 2018-06-17 LAB — CBC WITH DIFFERENTIAL/PLATELET
Basophils Absolute: 0.1 10*3/uL (ref 0.0–0.2)
Basos: 1 %
EOS (ABSOLUTE): 0.3 10*3/uL (ref 0.0–0.4)
Eos: 4 %
HEMOGLOBIN: 14 g/dL (ref 13.0–17.7)
Hematocrit: 41.2 % (ref 37.5–51.0)
Immature Grans (Abs): 0 10*3/uL (ref 0.0–0.1)
Immature Granulocytes: 0 %
Lymphocytes Absolute: 2.2 10*3/uL (ref 0.7–3.1)
Lymphs: 34 %
MCH: 31.2 pg (ref 26.6–33.0)
MCHC: 34 g/dL (ref 31.5–35.7)
MCV: 92 fL (ref 79–97)
MONOCYTES: 16 %
Monocytes Absolute: 1 10*3/uL — ABNORMAL HIGH (ref 0.1–0.9)
NEUTROS PCT: 45 %
Neutrophils Absolute: 2.8 10*3/uL (ref 1.4–7.0)
Platelets: 204 10*3/uL (ref 150–450)
RBC: 4.49 x10E6/uL (ref 4.14–5.80)
RDW: 12.5 % (ref 12.3–15.4)
WBC: 6.4 10*3/uL (ref 3.4–10.8)

## 2018-06-17 LAB — BASIC METABOLIC PANEL
BUN/Creatinine Ratio: 17 (ref 10–24)
BUN: 16 mg/dL (ref 8–27)
CALCIUM: 9.4 mg/dL (ref 8.6–10.2)
CHLORIDE: 100 mmol/L (ref 96–106)
CO2: 25 mmol/L (ref 20–29)
Creatinine, Ser: 0.92 mg/dL (ref 0.76–1.27)
GFR calc Af Amer: 98 mL/min/{1.73_m2} (ref >59)
GFR calc non Af Amer: 85 mL/min/{1.73_m2} (ref >59)
Glucose: 89 mg/dL (ref 65–99)
POTASSIUM: 4 mmol/L (ref 3.5–5.2)
Sodium: 141 mmol/L (ref 134–144)

## 2018-06-19 DIAGNOSIS — R3915 Urgency of urination: Secondary | ICD-10-CM | POA: Diagnosis not present

## 2018-06-20 NOTE — Telephone Encounter (Signed)
Spoke with Pt.  Last minute questions answered.  Reassurance given.

## 2018-06-23 ENCOUNTER — Encounter (HOSPITAL_COMMUNITY)
Admission: RE | Disposition: A | Discharge status: Home / Self Care | Payer: Self-pay | Source: Ambulatory Visit | Attending: Internal Medicine

## 2018-06-23 ENCOUNTER — Encounter (HOSPITAL_COMMUNITY): Payer: Self-pay | Admitting: *Deleted

## 2018-06-23 ENCOUNTER — Other Ambulatory Visit: Payer: Self-pay

## 2018-06-23 ENCOUNTER — Ambulatory Visit (HOSPITAL_COMMUNITY)
Admission: RE | Admit: 2018-06-23 | Discharge: 2018-06-23 | Disposition: A | Discharge status: Home / Self Care | Payer: PPO | Source: Ambulatory Visit | Attending: Internal Medicine | Admitting: Internal Medicine

## 2018-06-23 DIAGNOSIS — Z8249 Family history of ischemic heart disease and other diseases of the circulatory system: Secondary | ICD-10-CM | POA: Diagnosis not present

## 2018-06-23 DIAGNOSIS — K589 Irritable bowel syndrome without diarrhea: Secondary | ICD-10-CM | POA: Insufficient documentation

## 2018-06-23 DIAGNOSIS — Z7982 Long term (current) use of aspirin: Secondary | ICD-10-CM | POA: Insufficient documentation

## 2018-06-23 DIAGNOSIS — I471 Supraventricular tachycardia: Secondary | ICD-10-CM | POA: Diagnosis not present

## 2018-06-23 DIAGNOSIS — Z86010 Personal history of colonic polyps: Secondary | ICD-10-CM | POA: Diagnosis not present

## 2018-06-23 DIAGNOSIS — Z9889 Other specified postprocedural states: Secondary | ICD-10-CM | POA: Diagnosis not present

## 2018-06-23 HISTORY — PX: SVT ABLATION: EP1225

## 2018-06-23 SURGERY — SVT ABLATION

## 2018-06-23 MED ORDER — SODIUM CHLORIDE 0.9 % IV SOLN
INTRAVENOUS | Status: DC | PRN
  Administered 2018-06-23: 4 ug/min via INTRAVENOUS

## 2018-06-23 MED ORDER — SODIUM CHLORIDE 0.9 % IV SOLN
INTRAVENOUS | Status: DC
  Administered 2018-06-23: 09:00:00 via INTRAVENOUS

## 2018-06-23 MED ORDER — FENTANYL CITRATE (PF) 100 MCG/2ML IJ SOLN
INTRAMUSCULAR | Status: AC
  Filled 2018-06-23: qty 2

## 2018-06-23 MED ORDER — BUPIVACAINE HCL (PF) 0.25 % IJ SOLN
INTRAMUSCULAR | Status: AC
  Filled 2018-06-23: qty 60

## 2018-06-23 MED ORDER — FENTANYL CITRATE (PF) 100 MCG/2ML IJ SOLN
INTRAMUSCULAR | Status: DC | PRN
  Administered 2018-06-23: 12.5 ug via INTRAVENOUS
  Administered 2018-06-23: 25 ug via INTRAVENOUS
  Administered 2018-06-23 (×2): 12.5 ug via INTRAVENOUS

## 2018-06-23 MED ORDER — ONDANSETRON HCL 4 MG/2ML IJ SOLN: 4.0000 mg | Freq: Four times a day (QID) | INTRAMUSCULAR | Status: DC | PRN

## 2018-06-23 MED ORDER — SODIUM CHLORIDE 0.9 % IV SOLN: 250.0000 mL | INTRAVENOUS | Status: DC | PRN

## 2018-06-23 MED ORDER — ACETAMINOPHEN 325 MG PO TABS
650.0000 mg | ORAL_TABLET | ORAL | Status: DC | PRN
  Filled 2018-06-23: qty 2

## 2018-06-23 MED ORDER — BUPIVACAINE HCL (PF) 0.25 % IJ SOLN
INTRAMUSCULAR | Status: DC | PRN
  Administered 2018-06-23: 60 mL

## 2018-06-23 MED ORDER — SODIUM CHLORIDE 0.9% FLUSH: 3.0000 mL | Freq: Two times a day (BID) | INTRAVENOUS | Status: DC

## 2018-06-23 MED ORDER — ISOPROTERENOL HCL 0.2 MG/ML IJ SOLN
INTRAMUSCULAR | Status: AC
  Filled 2018-06-23: qty 5

## 2018-06-23 MED ORDER — MIDAZOLAM HCL 5 MG/5ML IJ SOLN
INTRAMUSCULAR | Status: DC | PRN
  Administered 2018-06-23 (×5): 1 mg via INTRAVENOUS

## 2018-06-23 MED ORDER — HEPARIN (PORCINE) IN NACL 1000-0.9 UT/500ML-% IV SOLN
INTRAVENOUS | Status: AC
  Filled 2018-06-23: qty 500

## 2018-06-23 MED ORDER — SODIUM CHLORIDE 0.9% FLUSH: 3.0000 mL | INTRAVENOUS | Status: DC | PRN

## 2018-06-23 MED ORDER — MIDAZOLAM HCL 5 MG/5ML IJ SOLN
INTRAMUSCULAR | Status: AC
  Filled 2018-06-23: qty 5

## 2018-06-23 MED ORDER — HEPARIN (PORCINE) IN NACL 1000-0.9 UT/500ML-% IV SOLN
INTRAVENOUS | Status: DC | PRN
  Administered 2018-06-23: 500 mL

## 2018-06-23 SURGICAL SUPPLY — 11 items
BAG SNAP BAND KOVER 36X36 (MISCELLANEOUS) ×3 IMPLANT
CATH CELSIUS THERMO F CV 7FR (ABLATOR) ×3 IMPLANT
CATH HEX JOSEPH 2-5-2 65CM 6F (CATHETERS) ×3 IMPLANT
CATH JOSEPH QUAD ALLRED 6F REP (CATHETERS) ×6 IMPLANT
PACK EP LATEX FREE (CUSTOM PROCEDURE TRAY) ×2
PACK EP LF (CUSTOM PROCEDURE TRAY) ×1 IMPLANT
PAD PRO RADIOLUCENT 2001M-C (PAD) ×3 IMPLANT
SHEATH PINNACLE 6F 10CM (SHEATH) ×6 IMPLANT
SHEATH PINNACLE 7F 10CM (SHEATH) ×3 IMPLANT
SHEATH PINNACLE 8F 10CM (SHEATH) ×3 IMPLANT
SHIELD RADPAD SCOOP 12X17 (MISCELLANEOUS) ×3 IMPLANT

## 2018-06-23 NOTE — CV Procedure (Signed)
Site area: right femoral vein, right IJ Site Prior to Removal: both level 0  Pressure Applied For: 20 mins Manual: yes Patient Status During Pull:  stable Post Pull Site: both level 0 Post Pull Instructions Given:  yes Post Pull Pulses Present: DP +2 Dressing Applied: gauze and tegaderm Bedrest begins @ 1230 Comments: n/a

## 2018-06-23 NOTE — Discharge Instructions (Signed)

## 2018-06-23 NOTE — H&P (Signed)
HPI [name] returns today for followup after an over 4 year absence from our arrhythmia clinic. He is a very pleasant 67 year old man who has a history of SVT and hospital visits for SVT dating back to 2010. He was last in the hospital in May when he was in SVT at 175/min. He was given IV adenosine with termination. He has had one prolonged episode of SVT on medical therapy.  No Known Allergies         Current Outpatient Medications  Medication Sig Dispense Refill  . aspirin 81 MG tablet Take 81 mg by mouth daily.    Marland Kitchen diltiazem (CARDIZEM CD) 120 MG 24 hr capsule TAKE ONE CAPSULE BY MOUTH DAILY 90 capsule 3  . diltiazem (CARDIZEM) 90 MG tablet Take 1 tablet (90 mg total) by mouth as needed (for episodes of rapid heart rate). 30 tablet 3  . hydrocortisone 2.5 % cream Apply 1 application topically as needed for rash.     No current facility-administered medications for this visit.          Past Medical History:  Diagnosis Date  . Chest pain    Nuc stress test 05/31/09 showed no evidence of ischemia, normal EF & no significant changes from previous study in 07/26/05. ECHO 05/31/09 showed normal EF & mild MR.  . Diverticulosis    Found by colonoscopy in 2010  . History of exercise stress test    GXT 6/19:  Normal   . Hx of colonic polyps    colonoscopy in 2007  . IBS (irritable bowel syndrome)   . Paroxysmal supraventricular tachycardia (HCC)    Diltiazem PRN. Nuc stress test 05/31/09 showed no evidence of ischemia, normal EF & no significant changes from previous study in 07/26/05. ECHO 05/31/09 showed normal EF & mild MR.  . Rosacea   . SVT (supraventricular tachycardia) (HCC)     ROS:   All systems reviewed and negative except as noted in the HPI.        Past Surgical History:  Procedure Laterality Date  . COLONOSCOPY  2007 & 2010   Hx of colon polyps & diverticulosis  . TONSILLECTOMY AND ADENOIDECTOMY  1957          Family History  Problem Relation Age of Onset  . Pancreatic cancer Mother   . Liver cancer Mother   . Heart attack Father      Social History        Socioeconomic History  . Marital status: Divorced    Spouse name: Not on file  . Number of children: Not on file  . Years of education: Not on file  . Highest education level: Not on file  Occupational History  . Not on file  Social Needs  . Financial resource strain: Not on file  . Food insecurity:    Worry: Not on file    Inability: Not on file  . Transportation needs:    Medical: Not on file    Non-medical: Not on file  Tobacco Use  . Smoking status: Never Smoker  . Smokeless tobacco: Never Used  Substance and Sexual Activity  . Alcohol use: No  . Drug use: No  . Sexual activity: Not on file  Lifestyle  . Physical activity:    Days per week: Not on file    Minutes per session: Not on file  . Stress: Not on file  Relationships  . Social connections:    Talks on phone:  Not on file    Gets together: Not on file    Attends religious service: Not on file    Active member of club or organization: Not on file    Attends meetings of clubs or organizations: Not on file    Relationship status: Not on file  . Intimate partner violence:    Fear of current or ex partner: Not on file    Emotionally abused: Not on file    Physically abused: Not on file    Forced sexual activity: Not on file  Other Topics Concern  . Not on file  Social History Narrative   Lives in [place] alone.  Divorced.   Retired Primary school teacher     BP 118/72   Pulse 65   Ht 5\' 11"  (1.803 m)   Wt 179 lb (81.2 kg)   SpO2 97%   BMI 24.97 kg/m   Physical Exam:  Well appearing NAD HEENT: Unremarkable Neck:  No JVD, no thyromegally Lymphatics:  No adenopathy Back:  No CVA tenderness Lungs:  Clear with no wheezes HEART:  Regular rate rhythm, no murmurs, no rubs, no clicks Abd:   soft, positive bowel sounds, no organomegally, no rebound, no guarding Ext:  2 plus pulses, no edema, no cyanosis, no clubbing Skin:  No rashes no nodules Neuro:  CN II through XII intact, motor grossly intact  EKG - nsr  Assess/Plan: 1. SVT - I have again discussed in detail the indications/risks/benefits/goals/expectations of EP study and catheter ablation of SVT and he will call us if he wishes to proceed with ablation. He has many questions. I spent 60 minutes including over 50% face to face time producing this note  Carleene Overlie Tekia Waterbury,M.D.  EP Attending  Patient seen and examined. Since his clinic visit no change in the history, exam, assessment and plan. For EP study and catheter ablation.  Mikle Bosworth.D.

## 2018-06-24 ENCOUNTER — Encounter (HOSPITAL_COMMUNITY): Payer: Self-pay | Admitting: Internal Medicine

## 2018-08-01 DIAGNOSIS — R05 Cough: Secondary | ICD-10-CM | POA: Diagnosis not present

## 2018-08-04 ENCOUNTER — Ambulatory Visit: Payer: PPO | Admitting: Internal Medicine

## 2018-08-04 ENCOUNTER — Encounter: Payer: Self-pay | Admitting: Internal Medicine

## 2018-08-04 VITALS — BP 130/70 | HR 72 | Ht 72.0 in | Wt 183.0 lb

## 2018-08-04 DIAGNOSIS — I471 Supraventricular tachycardia: Secondary | ICD-10-CM | POA: Diagnosis not present

## 2018-08-04 NOTE — Progress Notes (Signed)
HPI Jeffrey Jimenez returns today for followup of his SVT. 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 He has done well in the interim with no chest pain or sob. No syncope. No symptoms of more SVT. He has stopped his Cardizem.  No Known Allergies   Current Outpatient Medications  Medication Sig Dispense Refill  . aspirin 81 MG tablet Take 81 mg by mouth daily.     No current facility-administered medications for this visit.      Past Medical History:  Diagnosis Date  . Chest pain    Nuc stress test 05/31/09 showed no evidence of ischemia, normal EF & no significant changes from previous study in 07/26/05. ECHO 05/31/09 showed normal EF & mild MR.  . Diverticulosis    Found by colonoscopy in 2010  . History of exercise stress test    GXT 6/19:  Normal   . Hx of colonic polyps    colonoscopy in 2007  . IBS (irritable bowel syndrome)   . Paroxysmal supraventricular tachycardia (HCC)    Diltiazem PRN. Nuc stress test 05/31/09 showed no evidence of ischemia, normal EF & no significant changes from previous study in 07/26/05. ECHO 05/31/09 showed normal EF & mild MR.  . Rosacea   . SVT (supraventricular tachycardia) (HCC)     ROS:   All systems reviewed and negative except as noted in the HPI.   Past Surgical History:  Procedure Laterality Date  . COLONOSCOPY  2007 & 2010   Hx of colon polyps & diverticulosis  . SVT ABLATION N/A 06/23/2018   Procedure: SVT ABLATION;  Surgeon: Evans Lance, MD;  Location: [facility];  Service: Cardiovascular;  Laterality: N/A;  . TONSILLECTOMY AND ADENOIDECTOMY  1957     Family History  Problem Relation Age of Onset  . Pancreatic cancer Mother   . Liver cancer Mother   . Heart attack Father      Social History   Socioeconomic History  . Marital status: Divorced    Spouse name: Not on file  . Number of children: Not on file  . Years of education: Not on file  .  Highest education level: Not on file  Occupational History  . Not on file  Social Needs  . Financial resource strain: Not on file  . Food insecurity:    Worry: Not on file    Inability: Not on file  . Transportation needs:    Medical: Not on file    Non-medical: Not on file  Tobacco Use  . Smoking status: Never Smoker  . Smokeless tobacco: Never Used  Substance and Sexual Activity  . Alcohol use: No  . Drug use: No  . Sexual activity: Not on file  Lifestyle  . Physical activity:    Days per week: Not on file    Minutes per session: Not on file  . Stress: Not on file  Relationships  . Social connections:    Talks on phone: Not on file    Gets together: Not on file    Attends religious service: Not on file    Active member of club or organization: Not on file    Attends meetings of clubs or organizations: Not on file    Relationship status: Not on file  . Intimate partner violence:    Fear of current or ex partner: Not on file    Emotionally abused: Not  on file    Physically abused: Not on file    Forced sexual activity: Not on file  Other Topics Concern  . Not on file  Social History Narrative   Lives in [place] alone.  Divorced.   Retired Primary school teacher     BP 130/70   Pulse 72   Ht 6' (1.829 m)   Wt 183 lb (83 kg)   BMI 24.82 kg/m   Physical Exam:  Well appearing NAD HEENT: Unremarkable Neck:  No JVD, no thyromegally Back:  No CVA tenderness Lungs:  Clear with no wheezes HEART:  Regular rate rhythm, no murmurs, no rubs, no clicks Abd:  soft, positive bowel sounds, no organomegally, no rebound, no guarding Ext:  2 plus pulses, no edema, no cyanosis, no clubbing Skin:  No rashes no nodules Neuro:  CN II through XII intact, motor grossly intact  EKG - nsr  Assess/Plan: 1. SVT - he is doing well, s/p ablation. He will undergo watchful waiting.  2. Shoulder pain - this is arthritis. We discussed cardiac chest pain in detail. He does not  have this but I reviewed the symptoms he might experience if he were to develop angina.  Jeffrey Jimenez.D.

## 2018-08-04 NOTE — Patient Instructions (Addendum)
Medication Instructions:  Your physician recommends that you continue on your current medications as directed. Please refer to the Current Medication list given to you today.  Labwork: None ordered.  Testing/Procedures: None ordered.  Follow-Up: Your physician wants you to follow-up in: as needed.   Any Other Special Instructions Will Be Listed Below (If Applicable).  If you need a refill on your cardiac medications before your next appointment, please call your pharmacy.    

## 2018-09-15 DIAGNOSIS — R0982 Postnasal drip: Secondary | ICD-10-CM | POA: Diagnosis not present

## 2019-01-20 DIAGNOSIS — L821 Other seborrheic keratosis: Secondary | ICD-10-CM | POA: Diagnosis not present

## 2019-01-20 DIAGNOSIS — L570 Actinic keratosis: Secondary | ICD-10-CM | POA: Diagnosis not present

## 2019-01-20 DIAGNOSIS — D225 Melanocytic nevi of trunk: Secondary | ICD-10-CM | POA: Diagnosis not present

## 2019-01-20 DIAGNOSIS — B351 Tinea unguium: Secondary | ICD-10-CM | POA: Diagnosis not present

## 2019-01-20 DIAGNOSIS — D1801 Hemangioma of skin and subcutaneous tissue: Secondary | ICD-10-CM | POA: Diagnosis not present

## 2019-01-20 DIAGNOSIS — L814 Other melanin hyperpigmentation: Secondary | ICD-10-CM | POA: Diagnosis not present

## 2019-02-19 DIAGNOSIS — L578 Other skin changes due to chronic exposure to nonionizing radiation: Secondary | ICD-10-CM | POA: Diagnosis not present

## 2019-02-19 DIAGNOSIS — L718 Other rosacea: Secondary | ICD-10-CM | POA: Diagnosis not present

## 2019-02-19 DIAGNOSIS — L570 Actinic keratosis: Secondary | ICD-10-CM | POA: Diagnosis not present

## 2019-02-19 DIAGNOSIS — L308 Other specified dermatitis: Secondary | ICD-10-CM | POA: Diagnosis not present

## 2019-03-23 DIAGNOSIS — Q742 Other congenital malformations of lower limb(s), including pelvic girdle: Secondary | ICD-10-CM | POA: Diagnosis not present

## 2019-03-30 ENCOUNTER — Ambulatory Visit (INDEPENDENT_AMBULATORY_CARE_PROVIDER_SITE_OTHER): Payer: PPO | Admitting: Podiatry

## 2019-03-30 ENCOUNTER — Encounter: Payer: Self-pay | Admitting: Podiatry

## 2019-03-30 ENCOUNTER — Other Ambulatory Visit: Payer: Self-pay | Admitting: Podiatry

## 2019-03-30 ENCOUNTER — Ambulatory Visit (INDEPENDENT_AMBULATORY_CARE_PROVIDER_SITE_OTHER): Payer: PPO

## 2019-03-30 ENCOUNTER — Other Ambulatory Visit: Payer: Self-pay

## 2019-03-30 VITALS — BP 110/66 | HR 68 | Temp 98.0°F | Resp 16

## 2019-03-30 DIAGNOSIS — M6740 Ganglion, unspecified site: Secondary | ICD-10-CM | POA: Diagnosis not present

## 2019-03-30 DIAGNOSIS — M79675 Pain in left toe(s): Secondary | ICD-10-CM

## 2019-03-30 NOTE — Progress Notes (Signed)
Subjective:   Patient ID: Jeffrey Jimenez, male   DOB: 68 y.o.   MRN: [id number]   HPI Patient presents stating that he has a nodule which is formed on his left big toe for the last few weeks he is concerned about what it is and how quickly it is grown.  Patient does not smoke likes to be active   Review of Systems  All other systems reviewed and are negative.       Objective:  Physical Exam Vitals signs and nursing note reviewed.  Constitutional:      Appearance: He is well-developed.  Pulmonary:     Effort: Pulmonary effort is normal.  Musculoskeletal: Normal range of motion.  Skin:    General: Skin is warm.  Neurological:     Mental Status: He is alert.     Neurovascular status found to be intact muscle strength was found to be adequate range of motion within normal limits.  Patient was noted to have a mass at the inner phalangeal joint left hallux measuring about 4 x 4 mm that appears to be freely movable within subcutaneous tissue with distention noted.  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  Sterile prep applied and using sterile instrumentation and 20-gauge needle I did aspirate it getting out a gelatinous fluid and injected a small amount of steroid to reduce the inflammation.  I then applied sterile dressing compression and advised on wider shoes and patient will be seen back if it returns for other treatment  X-ray indicates that there is a small mass on the left hallux but I did not note any calcification or bone or spur injury signed visit

## 2019-03-30 NOTE — Progress Notes (Signed)
   Subjective:    Patient ID: Jeffrey Jimenez, male    DOB: May 12, 1951, 68 y.o.   MRN: [id number]  HPI    Review of Systems  All other systems reviewed and are negative.      Objective:   Physical Exam        Assessment & Plan:

## 2019-04-22 ENCOUNTER — Encounter: Payer: Self-pay | Admitting: Cardiology

## 2019-04-22 ENCOUNTER — Ambulatory Visit: Payer: PPO | Admitting: Cardiology

## 2019-04-22 ENCOUNTER — Other Ambulatory Visit: Payer: Self-pay

## 2019-04-22 VITALS — BP 110/70 | HR 70 | Ht 71.0 in | Wt 184.0 lb

## 2019-04-22 DIAGNOSIS — I471 Supraventricular tachycardia: Secondary | ICD-10-CM

## 2019-04-22 DIAGNOSIS — I248 Other forms of acute ischemic heart disease: Secondary | ICD-10-CM | POA: Diagnosis not present

## 2019-04-22 DIAGNOSIS — R0789 Other chest pain: Secondary | ICD-10-CM

## 2019-04-22 NOTE — Progress Notes (Signed)
Cardiology Office Note    Date:  04/22/2019   ID:  Jeffrey Jimenez, DOB Apr 21, 1951, MRN [id number]  PCP:  Gaynelle Arabian, MD  Cardiologist:   Candee Furbish, MD     History of Present Illness:  Jeffrey Jimenez is a 68 y.o. male here for follow up PSVT post ablatoin by Dr. Lovena Le - 2019. Excellent response.   Has had demand ischemia with these perior SVT episodes. ECHO in 2010 normal EF, mild MR, NUC in 2010 no ischemia. Symptoms are dizziness. Previously not able to terminate with vagal maneuvers. No preexcitation on ECG. On Cardizem 120 once a day.  Around 02/21/18 - playing pickle ball felt PSVT. Before at rest. Could not get rid of it. Adenosine 6 x 2 had to be used. 174bpm. Looks like AVNRT (retrograde P).  He is done extensive reading about this.  Symptomatic.  Nervous about possible ablation but he feels that he is convincing himself after reading the article on the success rates.  No bleeding syncope orthopnea PND.  04/22/2019 - Doing well now post ablation. No CP, no SOB, no f/v/n/v. Feels great. Playing Pickle ball 1.5 hours a day, clearing bush and trees 4 hours after that.   Past Medical History:  Diagnosis Date  . Chest pain    Nuc stress test 05/31/09 showed no evidence of ischemia, normal EF & no significant changes from previous study in 07/26/05. ECHO 05/31/09 showed normal EF & mild MR.  . Diverticulosis    Found by colonoscopy in 2010  . History of exercise stress test    GXT 6/19:  Normal   . Hx of colonic polyps    colonoscopy in 2007  . IBS (irritable bowel syndrome)   . Paroxysmal supraventricular tachycardia (HCC)    Diltiazem PRN. Nuc stress test 05/31/09 showed no evidence of ischemia, normal EF & no significant changes from previous study in 07/26/05. ECHO 05/31/09 showed normal EF & mild MR.  . Rosacea   . SVT (supraventricular tachycardia) (HCC)     Past Surgical History:  Procedure Laterality Date  . COLONOSCOPY  2007 & 2010   Hx of colon  polyps & diverticulosis  . SVT ABLATION N/A 06/23/2018   Procedure: SVT ABLATION;  Surgeon: Evans Lance, MD;  Location: [facility];  Service: Cardiovascular;  Laterality: N/A;  . TONSILLECTOMY AND ADENOIDECTOMY  1957    Current Medications: Outpatient Medications Prior to Visit  Medication Sig Dispense Refill  . aspirin 81 MG tablet Take 81 mg by mouth daily.     No facility-administered medications prior to visit.      Allergies:   Patient has no known allergies.   Social History   Socioeconomic History  . Marital status: Divorced    Spouse name: Not on file  . Number of children: Not on file  . Years of education: Not on file  . Highest education level: Not on file  Occupational History  . Not on file  Social Needs  . Financial resource strain: Not on file  . Food insecurity    Worry: Not on file    Inability: Not on file  . Transportation needs    Medical: Not on file    Non-medical: Not on file  Tobacco Use  . Smoking status: Never Smoker  . Smokeless tobacco: Never Used  Substance and Sexual Activity  . Alcohol use: No  . Drug use: No  . Sexual activity: Not on file  Lifestyle  .  Physical activity    Days per week: Not on file    Minutes per session: Not on file  . Stress: Not on file  Relationships  . Social Herbalist on phone: Not on file    Gets together: Not on file    Attends religious service: Not on file    Active member of club or organization: Not on file    Attends meetings of clubs or organizations: Not on file    Relationship status: Not on file  Other Topics Concern  . Not on file  Social History Narrative   Lives in [place] alone.  Divorced.   Retired Primary school teacher     Family History:  The patient's family history includes Heart attack in his father; Liver cancer in his mother; Pancreatic cancer in his mother.   ROS:   Please see the history of present illness.    Review of Systems  All other  systems reviewed and are negative.     PHYSICAL EXAM:   VS:  BP 110/70   Pulse 70   Ht 5\' 11"  (1.803 m)   Wt 184 lb (83.5 kg)   SpO2 98%   BMI 25.66 kg/m    GEN: Well nourished, well developed, in no acute distress  HEENT: normal  Neck: no JVD, carotid bruits, or masses Cardiac: RRR; no murmurs, rubs, or gallops,no edema  Respiratory:  clear to auscultation bilaterally, normal work of breathing GI: soft, nontender, nondistended, + BS MS: no deformity or atrophy  Skin: warm and dry, no rash Neuro:  Alert and Oriented x 3, Strength and sensation are intact Psych: euthymic mood, full affect      Wt Readings from Last 3 Encounters:  04/22/19 184 lb (83.5 kg)  08/04/18 183 lb (83 kg)  06/23/18 180 lb (81.6 kg)      Studies/Labs Reviewed:   EKG: May 2019 -narrow complex tachycardia with retrograde P waves likely AVNRT heart rate 174 bpm.  11/07/17-sinus rhythm 66 with no other abnormality except for nonspecific T wave changes aVF.  10/02/16-sinus bradycardia rate 58 with no other abnormalities. Personally viewed  Recent Labs: 06/16/2018: BUN 16; Creatinine, Ser 0.92; Hemoglobin 14.0; Platelets 204; Potassium 4.0; Sodium 141   Lipid Panel No results found for: CHOL, TRIG, HDL, CHOLHDL, VLDL, LDLCALC, LDLDIRECT  Additional studies/ records that were reviewed today include:  Prior ECG, labs, ECHO, NUC, hospital records.     ASSESSMENT:    1. SVT (supraventricular tachycardia) ([place])   2. Demand ischemia ([place])   3. Other chest pain      PLAN:  In order of problems listed above:  PSVT  - EP, Dr. Lovena Le post ablation 06/23/2018. Doing well.  He is studied extensively about SVT ablation. Shoulder pain post office visit likely [facility]. No further issue. Very pleased. Blessed he says. No Cardizem.   - Off of Cardizem long-acting 120 mg once a day.  Demand ischemia  - prior SVT associated with elevated troponin.   - Prior nuclear stress test with no ischemia.  Continue to  monitor. Doing well.    Medication Adjustments/Labs and Tests Ordered: Current medicines are reviewed at length with the patient today.  Concerns regarding medicines are outlined above.  Medication changes, Labs and Tests ordered today are listed in the Patient Instructions below. Patient Instructions  Medication Instructions:  Your provider recommends that you continue on your current medications as directed. Please refer to the Current Medication list given to you today.  Labwork: None  Testing/Procedures: None  Follow-Up: Your provider recommends that you schedule a follow-up appointment AS NEEDED! Let us know if you need anything.    Signed, Candee Furbish, MD  04/22/2019 9:07 AM    [facility] [address], [place], [place]  [address] Phone: [phone]; Fax: [phone]

## 2019-04-22 NOTE — Patient Instructions (Signed)
Medication Instructions:  Your provider recommends that you continue on your current medications as directed. Please refer to the Current Medication list given to you today.    Labwork: None  Testing/Procedures: None  Follow-Up: Your provider recommends that you schedule a follow-up appointment AS NEEDED! Let us know if you need anything.

## 2019-06-02 DIAGNOSIS — I471 Supraventricular tachycardia: Secondary | ICD-10-CM | POA: Diagnosis not present

## 2019-06-02 DIAGNOSIS — Z131 Encounter for screening for diabetes mellitus: Secondary | ICD-10-CM | POA: Diagnosis not present

## 2019-06-02 DIAGNOSIS — Z1389 Encounter for screening for other disorder: Secondary | ICD-10-CM | POA: Diagnosis not present

## 2019-06-02 DIAGNOSIS — Z0000 Encounter for general adult medical examination without abnormal findings: Secondary | ICD-10-CM | POA: Diagnosis not present

## 2019-06-02 DIAGNOSIS — Z23 Encounter for immunization: Secondary | ICD-10-CM | POA: Diagnosis not present

## 2019-06-02 DIAGNOSIS — Z125 Encounter for screening for malignant neoplasm of prostate: Secondary | ICD-10-CM | POA: Diagnosis not present

## 2019-08-13 DIAGNOSIS — J3489 Other specified disorders of nose and nasal sinuses: Secondary | ICD-10-CM | POA: Diagnosis not present

## 2019-08-13 DIAGNOSIS — Z20828 Contact with and (suspected) exposure to other viral communicable diseases: Secondary | ICD-10-CM | POA: Diagnosis not present

## 2019-08-31 ENCOUNTER — Ambulatory Visit: Payer: PPO | Attending: Internal Medicine

## 2019-08-31 DIAGNOSIS — Z23 Encounter for immunization: Secondary | ICD-10-CM

## 2019-08-31 NOTE — Progress Notes (Signed)
   Covid-19 Vaccination Clinic  Name:  CHANCELER KOLB    MRN: [id number] DOB: 07-05-1951  08/31/2019  Mr. Delduca was observed post Covid-19 immunization for 15 minutes without incidence. He was provided with Vaccine Information Sheet and instruction to access the V-Safe system.   Mr. Teater was instructed to call 911 with any severe reactions post vaccine: Marland Kitchen Difficulty breathing  . Swelling of your face and throat  . A fast heartbeat  . A bad rash all over your body  . Dizziness and weakness    Immunizations Administered    Name Date Dose VIS Date Route   Pfizer COVID-19 Vaccine 08/31/2019  9:39 AM 0.3 mL 07/17/2019 Intramuscular   Manufacturer: [place]   Lot: [id number]   [place]: [id number]

## 2019-09-18 ENCOUNTER — Ambulatory Visit: Payer: PPO | Attending: Internal Medicine

## 2019-09-18 DIAGNOSIS — Z23 Encounter for immunization: Secondary | ICD-10-CM | POA: Insufficient documentation

## 2019-09-18 NOTE — Progress Notes (Signed)
   Covid-19 Vaccination Clinic  Name:  Jeffrey Jimenez    MRN: [id number] DOB: [birth year]  09/18/2019  Mr. Marcil was observed post Covid-19 immunization for 15 minutes without incidence. He was provided with Vaccine Information Sheet and instruction to access the V-Safe system.   Mr. Minckler was instructed to call 911 with any severe reactions post vaccine: Marland Kitchen Difficulty breathing  . Swelling of your face and throat  . A fast heartbeat  . A bad rash all over your body  . Dizziness and weakness    Immunizations Administered    Name Date Dose VIS Date Route   Pfizer COVID-19 Vaccine 09/18/2019 11:43 AM 0.3 mL 07/17/2019 Intramuscular   Manufacturer: [place]   Lot: [id number]   [place]: [id number]

## 2020-02-22 DIAGNOSIS — L814 Other melanin hyperpigmentation: Secondary | ICD-10-CM | POA: Diagnosis not present

## 2020-02-22 DIAGNOSIS — D225 Melanocytic nevi of trunk: Secondary | ICD-10-CM | POA: Diagnosis not present

## 2020-02-22 DIAGNOSIS — L570 Actinic keratosis: Secondary | ICD-10-CM | POA: Diagnosis not present

## 2020-02-22 DIAGNOSIS — D1801 Hemangioma of skin and subcutaneous tissue: Secondary | ICD-10-CM | POA: Diagnosis not present

## 2020-02-22 DIAGNOSIS — L821 Other seborrheic keratosis: Secondary | ICD-10-CM | POA: Diagnosis not present

## 2020-03-11 ENCOUNTER — Encounter (HOSPITAL_BASED_OUTPATIENT_CLINIC_OR_DEPARTMENT_OTHER): Payer: Self-pay | Admitting: Emergency Medicine

## 2020-03-11 ENCOUNTER — Emergency Department (HOSPITAL_BASED_OUTPATIENT_CLINIC_OR_DEPARTMENT_OTHER)
Admission: EM | Admit: 2020-03-11 | Discharge: 2020-03-12 | Disposition: A | Discharge status: Home / Self Care | Payer: PPO | Attending: Emergency Medicine | Admitting: Emergency Medicine

## 2020-03-11 ENCOUNTER — Other Ambulatory Visit: Payer: Self-pay

## 2020-03-11 ENCOUNTER — Emergency Department (HOSPITAL_BASED_OUTPATIENT_CLINIC_OR_DEPARTMENT_OTHER): Payer: PPO

## 2020-03-11 DIAGNOSIS — Y92410 Unspecified street and highway as the place of occurrence of the external cause: Secondary | ICD-10-CM | POA: Insufficient documentation

## 2020-03-11 DIAGNOSIS — Y999 Unspecified external cause status: Secondary | ICD-10-CM | POA: Diagnosis not present

## 2020-03-11 DIAGNOSIS — M25539 Pain in unspecified wrist: Secondary | ICD-10-CM | POA: Diagnosis not present

## 2020-03-11 DIAGNOSIS — Y9389 Activity, other specified: Secondary | ICD-10-CM | POA: Insufficient documentation

## 2020-03-11 DIAGNOSIS — S6992XA Unspecified injury of left wrist, hand and finger(s), initial encounter: Secondary | ICD-10-CM | POA: Diagnosis present

## 2020-03-11 DIAGNOSIS — R52 Pain, unspecified: Secondary | ICD-10-CM | POA: Diagnosis not present

## 2020-03-11 DIAGNOSIS — S52592A Other fractures of lower end of left radius, initial encounter for closed fracture: Secondary | ICD-10-CM | POA: Diagnosis not present

## 2020-03-11 DIAGNOSIS — S52502A Unspecified fracture of the lower end of left radius, initial encounter for closed fracture: Secondary | ICD-10-CM | POA: Diagnosis not present

## 2020-03-11 NOTE — ED Notes (Signed)
ED Provider at bedside discussing xr results and plan of care.
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
## 2020-03-17 DIAGNOSIS — S52502A Unspecified fracture of the lower end of left radius, initial encounter for closed fracture: Secondary | ICD-10-CM | POA: Diagnosis not present

## 2020-03-18 DIAGNOSIS — Y999 Unspecified external cause status: Secondary | ICD-10-CM | POA: Diagnosis not present

## 2020-03-18 DIAGNOSIS — G8918 Other acute postprocedural pain: Secondary | ICD-10-CM | POA: Diagnosis not present

## 2020-03-18 DIAGNOSIS — X58XXXA Exposure to other specified factors, initial encounter: Secondary | ICD-10-CM | POA: Diagnosis not present

## 2020-03-18 DIAGNOSIS — S52572A Other intraarticular fracture of lower end of left radius, initial encounter for closed fracture: Secondary | ICD-10-CM | POA: Diagnosis not present

## 2020-04-01 DIAGNOSIS — S52502A Unspecified fracture of the lower end of left radius, initial encounter for closed fracture: Secondary | ICD-10-CM | POA: Diagnosis not present

## 2020-04-05 DIAGNOSIS — H5211 Myopia, right eye: Secondary | ICD-10-CM | POA: Diagnosis not present

## 2020-04-05 DIAGNOSIS — Z961 Presence of intraocular lens: Secondary | ICD-10-CM | POA: Diagnosis not present

## 2020-04-05 DIAGNOSIS — H524 Presbyopia: Secondary | ICD-10-CM | POA: Diagnosis not present

## 2020-04-05 DIAGNOSIS — H43813 Vitreous degeneration, bilateral: Secondary | ICD-10-CM | POA: Diagnosis not present

## 2020-04-12 DIAGNOSIS — M25552 Pain in left hip: Secondary | ICD-10-CM | POA: Diagnosis not present

## 2020-04-15 DIAGNOSIS — Z4789 Encounter for other orthopedic aftercare: Secondary | ICD-10-CM | POA: Diagnosis not present

## 2020-04-15 DIAGNOSIS — S52502A Unspecified fracture of the lower end of left radius, initial encounter for closed fracture: Secondary | ICD-10-CM | POA: Diagnosis not present

## 2020-04-15 DIAGNOSIS — M25632 Stiffness of left wrist, not elsewhere classified: Secondary | ICD-10-CM | POA: Diagnosis not present

## 2020-04-19 DIAGNOSIS — S52502A Unspecified fracture of the lower end of left radius, initial encounter for closed fracture: Secondary | ICD-10-CM | POA: Diagnosis not present

## 2020-04-28 DIAGNOSIS — S52502A Unspecified fracture of the lower end of left radius, initial encounter for closed fracture: Secondary | ICD-10-CM | POA: Diagnosis not present

## 2020-05-03 DIAGNOSIS — S52502A Unspecified fracture of the lower end of left radius, initial encounter for closed fracture: Secondary | ICD-10-CM | POA: Diagnosis not present

## 2020-05-05 DIAGNOSIS — S52502A Unspecified fracture of the lower end of left radius, initial encounter for closed fracture: Secondary | ICD-10-CM | POA: Diagnosis not present

## 2020-05-10 DIAGNOSIS — S52502A Unspecified fracture of the lower end of left radius, initial encounter for closed fracture: Secondary | ICD-10-CM | POA: Diagnosis not present

## 2020-05-12 DIAGNOSIS — S52502A Unspecified fracture of the lower end of left radius, initial encounter for closed fracture: Secondary | ICD-10-CM | POA: Diagnosis not present

## 2020-05-17 DIAGNOSIS — M25632 Stiffness of left wrist, not elsewhere classified: Secondary | ICD-10-CM | POA: Diagnosis not present

## 2020-05-17 DIAGNOSIS — S52502A Unspecified fracture of the lower end of left radius, initial encounter for closed fracture: Secondary | ICD-10-CM | POA: Diagnosis not present

## 2020-05-17 DIAGNOSIS — Z4789 Encounter for other orthopedic aftercare: Secondary | ICD-10-CM | POA: Diagnosis not present

## 2020-05-19 DIAGNOSIS — S52502A Unspecified fracture of the lower end of left radius, initial encounter for closed fracture: Secondary | ICD-10-CM | POA: Diagnosis not present

## 2020-05-24 DIAGNOSIS — S52502A Unspecified fracture of the lower end of left radius, initial encounter for closed fracture: Secondary | ICD-10-CM | POA: Diagnosis not present

## 2020-05-26 DIAGNOSIS — S52502A Unspecified fracture of the lower end of left radius, initial encounter for closed fracture: Secondary | ICD-10-CM | POA: Diagnosis not present

## 2020-05-31 DIAGNOSIS — S52502A Unspecified fracture of the lower end of left radius, initial encounter for closed fracture: Secondary | ICD-10-CM | POA: Diagnosis not present

## 2020-06-02 DIAGNOSIS — Z0000 Encounter for general adult medical examination without abnormal findings: Secondary | ICD-10-CM | POA: Diagnosis not present

## 2020-06-02 DIAGNOSIS — Z1389 Encounter for screening for other disorder: Secondary | ICD-10-CM | POA: Diagnosis not present

## 2020-06-02 DIAGNOSIS — I471 Supraventricular tachycardia: Secondary | ICD-10-CM | POA: Diagnosis not present

## 2020-06-02 DIAGNOSIS — Z23 Encounter for immunization: Secondary | ICD-10-CM | POA: Diagnosis not present

## 2020-06-02 DIAGNOSIS — Z131 Encounter for screening for diabetes mellitus: Secondary | ICD-10-CM | POA: Diagnosis not present

## 2020-06-02 DIAGNOSIS — Z125 Encounter for screening for malignant neoplasm of prostate: Secondary | ICD-10-CM | POA: Diagnosis not present

## 2020-06-03 DIAGNOSIS — S52502A Unspecified fracture of the lower end of left radius, initial encounter for closed fracture: Secondary | ICD-10-CM | POA: Diagnosis not present

## 2020-06-07 DIAGNOSIS — M25642 Stiffness of left hand, not elsewhere classified: Secondary | ICD-10-CM | POA: Diagnosis not present

## 2020-06-09 DIAGNOSIS — M25632 Stiffness of left wrist, not elsewhere classified: Secondary | ICD-10-CM | POA: Diagnosis not present

## 2020-06-14 DIAGNOSIS — M25642 Stiffness of left hand, not elsewhere classified: Secondary | ICD-10-CM | POA: Diagnosis not present

## 2020-06-14 DIAGNOSIS — Z4789 Encounter for other orthopedic aftercare: Secondary | ICD-10-CM | POA: Diagnosis not present

## 2020-06-14 DIAGNOSIS — S52515D Nondisplaced fracture of left radial styloid process, subsequent encounter for closed fracture with routine healing: Secondary | ICD-10-CM | POA: Diagnosis not present

## 2020-06-15 DIAGNOSIS — K5790 Diverticulosis of intestine, part unspecified, without perforation or abscess without bleeding: Secondary | ICD-10-CM | POA: Diagnosis not present

## 2020-06-15 DIAGNOSIS — R1030 Lower abdominal pain, unspecified: Secondary | ICD-10-CM | POA: Diagnosis not present

## 2020-07-14 DIAGNOSIS — M25632 Stiffness of left wrist, not elsewhere classified: Secondary | ICD-10-CM | POA: Diagnosis not present

## 2020-07-25 DIAGNOSIS — M7711 Lateral epicondylitis, right elbow: Secondary | ICD-10-CM | POA: Diagnosis not present

## 2020-07-25 DIAGNOSIS — M25521 Pain in right elbow: Secondary | ICD-10-CM | POA: Diagnosis not present

## 2020-08-25 DIAGNOSIS — U071 COVID-19: Secondary | ICD-10-CM | POA: Diagnosis not present

## 2020-09-20 DIAGNOSIS — M25532 Pain in left wrist: Secondary | ICD-10-CM | POA: Diagnosis not present

## 2020-11-10 DIAGNOSIS — L821 Other seborrheic keratosis: Secondary | ICD-10-CM | POA: Diagnosis not present

## 2020-11-10 DIAGNOSIS — L718 Other rosacea: Secondary | ICD-10-CM | POA: Diagnosis not present

## 2020-11-10 DIAGNOSIS — L570 Actinic keratosis: Secondary | ICD-10-CM | POA: Diagnosis not present

## 2020-11-10 DIAGNOSIS — D225 Melanocytic nevi of trunk: Secondary | ICD-10-CM | POA: Diagnosis not present

## 2020-11-10 DIAGNOSIS — L814 Other melanin hyperpigmentation: Secondary | ICD-10-CM | POA: Diagnosis not present

## 2021-01-24 DIAGNOSIS — L439 Lichen planus, unspecified: Secondary | ICD-10-CM | POA: Diagnosis not present

## 2021-01-24 DIAGNOSIS — D485 Neoplasm of uncertain behavior of skin: Secondary | ICD-10-CM | POA: Diagnosis not present

## 2021-03-01 ENCOUNTER — Emergency Department (HOSPITAL_BASED_OUTPATIENT_CLINIC_OR_DEPARTMENT_OTHER)
Admission: EM | Admit: 2021-03-01 | Discharge: 2021-03-01 | Disposition: A | Discharge status: Home / Self Care | Payer: PPO | Attending: Emergency Medicine | Admitting: Emergency Medicine

## 2021-03-01 ENCOUNTER — Other Ambulatory Visit: Payer: Self-pay

## 2021-03-01 ENCOUNTER — Encounter (HOSPITAL_BASED_OUTPATIENT_CLINIC_OR_DEPARTMENT_OTHER): Payer: Self-pay | Admitting: *Deleted

## 2021-03-01 DIAGNOSIS — Z7982 Long term (current) use of aspirin: Secondary | ICD-10-CM | POA: Diagnosis not present

## 2021-03-01 DIAGNOSIS — Z77098 Contact with and (suspected) exposure to other hazardous, chiefly nonmedicinal, chemicals: Secondary | ICD-10-CM | POA: Diagnosis not present

## 2021-03-01 DIAGNOSIS — T6594XA Toxic effect of unspecified substance, undetermined, initial encounter: Secondary | ICD-10-CM | POA: Diagnosis not present

## 2021-03-01 DIAGNOSIS — X58XXXA Exposure to other specified factors, initial encounter: Secondary | ICD-10-CM | POA: Diagnosis not present

## 2021-03-01 MED ORDER — FLUORESCEIN SODIUM 1 MG OP STRP
ORAL_STRIP | OPHTHALMIC | Status: AC
  Administered 2021-03-01: 1 via OPHTHALMIC
  Filled 2021-03-01: qty 1

## 2021-03-01 MED ORDER — FLUORESCEIN SODIUM 1 MG OP STRP: 1.0000 | ORAL_STRIP | Freq: Once | OPHTHALMIC | Status: AC

## 2021-03-01 MED ORDER — TETRACAINE HCL 0.5 % OP SOLN: 2.0000 [drp] | Freq: Once | OPHTHALMIC | Status: AC

## 2021-03-01 MED ORDER — TETRACAINE HCL 0.5 % OP SOLN
OPHTHALMIC | Status: AC
  Administered 2021-03-01: 2 [drp] via OPHTHALMIC
  Filled 2021-03-01: qty 4

## 2021-03-01 NOTE — ED Notes (Signed)
PH paper and morgan lens placed at bedside. ED PA made aware of poison control advice and at bedside speaking with patient at this time.
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
## 2021-03-01 NOTE — ED Provider Notes (Signed)
[facility] [facility] EMERGENCY DEPARTMENT Provider Note   CSN: [id number] Arrival date & time: 03/01/21  1656     History Chief Complaint  Patient presents with   Chemical Exposure    LINKON CHESBRO is a 70 y.o. male with history of SVT, cataract surgery.  Patient presents emerged department with a chief complaint of chemical exposure to eyes.  Patient states that 50/50 mix of Triclopyr 4 and Basal Oil in eyes and face today.  Patient reports that chemicals accidentally splashed into his face.  Patient reports that chemicals got into his eyes.  Patient denies swallowing any of the chemicals.  Chemical exposure occurred approximately 1425.  Patient denies any eye pain, visual disturbance, blindness, eye discharge.  Patient does not wear contact lenses.  Patient sees Dr. Satira Sark with [facility] ophthalmology.    HPI     Past Medical History:  Diagnosis Date   Chest pain    Nuc stress test 05/31/09 showed no evidence of ischemia, normal EF & no significant changes from previous study in 07/26/05. ECHO 05/31/09 showed normal EF & mild MR.   Diverticulosis    Found by colonoscopy in 2010   History of exercise stress test    GXT 6/19:  Normal    Hx of colonic polyps    colonoscopy in 2007   IBS (irritable bowel syndrome)    Paroxysmal supraventricular tachycardia (HCC)    Diltiazem PRN. Nuc stress test 05/31/09 showed no evidence of ischemia, normal EF & no significant changes from previous study in 07/26/05. ECHO 05/31/09 showed normal EF & mild MR.   Rosacea    SVT (supraventricular tachycardia) [facility])     Patient Active Problem List   Diagnosis Date Noted   Demand ischemia ([place]) 10/02/2016   SVT (supraventricular tachycardia) ([place]) 12/22/2012    Past Surgical History:  Procedure Laterality Date   COLONOSCOPY  2007 & 2010   Hx of colon polyps & diverticulosis   SVT ABLATION N/A 06/23/2018   Procedure: SVT ABLATION;  Surgeon: Evans Lance, MD;  Location: [place]  CV LAB;  Service: Cardiovascular;  Laterality: N/A;   TONSILLECTOMY AND ADENOIDECTOMY  1957       Family History  Problem Relation Age of Onset   Pancreatic cancer Mother    Liver cancer Mother    Heart attack Father     Social History   Tobacco Use   Smoking status: Never   Smokeless tobacco: Never  Vaping Use   Vaping Use: Never used  Substance Use Topics   Alcohol use: Yes    Comment: occ   Drug use: No    Home Medications Prior to Admission medications   Medication Sig Start Date End Date Taking? Authorizing Provider  aspirin 81 MG tablet Take 81 mg by mouth daily.    [provider]    Allergies    Patient has no known allergies.  Review of Systems   Review of Systems  HENT:  Negative for facial swelling and sore throat.   Eyes:  Negative for photophobia, pain, discharge, redness, itching and visual disturbance.  Skin:  Negative for color change, pallor, rash and wound.  Psychiatric/Behavioral:  Negative for confusion.    Physical Exam Updated Vital Signs BP 131/90   Pulse 67   Temp 98 F (36.7 C) (Oral)   Resp 16   Ht '5\' 11"'$  (1.803 m)   Wt 83.9 kg   SpO2 99%   BMI 25.80 kg/m   Physical Exam  Vitals and nursing note reviewed.  Constitutional:      General: He is not in acute distress.    Appearance: He is not ill-appearing, toxic-appearing or diaphoretic.  HENT:     Head: Normocephalic.  Eyes:     General: Lids are everted, no foreign bodies appreciated. Vision grossly intact. No scleral icterus.       Right eye: No foreign body, discharge or hordeolum.        Left eye: No foreign body, discharge or hordeolum.     Extraocular Movements: Extraocular movements intact.     Conjunctiva/sclera: Conjunctivae normal.     Pupils: Pupils are equal, round, and reactive to light.     Right eye: No corneal abrasion or fluorescein uptake. Seidel exam negative.     Left eye: No corneal abrasion or fluorescein uptake. Seidel exam negative.     Comments: No pain with EOM.  pH 8 to bilateral eyes.  Cardiovascular:     Rate and Rhythm: Normal rate.  Pulmonary:     Effort: Pulmonary effort is normal.  Skin:    General: Skin is warm and dry.  Neurological:     General: No focal deficit present.     Mental Status: He is alert and oriented to person, place, and time.     GCS: GCS eye subscore is 4. GCS verbal subscore is 5. GCS motor subscore is 6.  Psychiatric:        Behavior: Behavior is cooperative.    ED Results / Procedures / Treatments   Labs (all labs ordered are listed, but only abnormal results are displayed) Labs Reviewed - No data to display  EKG None  Radiology No results found.  Procedures Procedures   Medications Ordered in ED Medications  fluorescein ophthalmic strip 1 strip (1 strip Both Eyes Given by Other 03/01/21 1740)  tetracaine (PONTOCAINE) 0.5 % ophthalmic solution 2 drop (2 drops Both Eyes Given by Other 03/01/21 1740)    ED Course  I have reviewed the triage vital signs and the nursing notes.  Pertinent labs & imaging results that were available during my care of the patient were reviewed by me and considered in my medical decision making (see chart for details).    MDM Rules/Calculators/A&P                           Alert 70 year old male no acute distress, nontoxic-appearing.  Presents emergency department with a chief complaint of chemical exposure to eyes.  50/50 mix of Triclopyr 4 and Basal Oil splashed into eyes.  Patient does not wear contacts.  Patient denies any blindness, visual disturbance, eye pain, eye discharge.  EOM intact.  No pain with EOM.  Pupils PERRL.  pH 8 to bilateral eyes.  No fluorescein uptake.  No foreign bodies noted.  Negative Seidel sign visual acuity 20/50 right eye and 20/25 to left eye.  Poison control was contacted by RN Mel Almond, poison control recommends Morgan lens for 15 minutes, monitor blurred vision, possible referral to eye doctor.  Patient  reassessed after eyes flushed with Lilia Pro lens.  Continues to deny any eye pain, eye discharge, blindness, visual disturbance.  pH rechecked found to be between 7 and 8.  We will have patient follow-up with his ophthalmologist.  Patient given strict return precautions.  Patient expressed understanding of all instructions and is agreeable with this plan.  Final Clinical Impression(s) / ED Diagnoses Final diagnoses:  None  Rx / DC Orders ED Discharge Orders     None        Dyann Ruddle 03/01/21 Andria Frames, MD 03/09/21 [phone]

## 2021-03-02 DIAGNOSIS — H10413 Chronic giant papillary conjunctivitis, bilateral: Secondary | ICD-10-CM | POA: Diagnosis not present

## 2021-04-05 DIAGNOSIS — H02403 Unspecified ptosis of bilateral eyelids: Secondary | ICD-10-CM | POA: Diagnosis not present

## 2021-04-05 DIAGNOSIS — H02834 Dermatochalasis of left upper eyelid: Secondary | ICD-10-CM | POA: Diagnosis not present

## 2021-04-05 DIAGNOSIS — H02831 Dermatochalasis of right upper eyelid: Secondary | ICD-10-CM | POA: Diagnosis not present

## 2021-04-11 DIAGNOSIS — H43813 Vitreous degeneration, bilateral: Secondary | ICD-10-CM | POA: Diagnosis not present

## 2021-04-11 DIAGNOSIS — H524 Presbyopia: Secondary | ICD-10-CM | POA: Diagnosis not present

## 2021-04-11 DIAGNOSIS — Z961 Presence of intraocular lens: Secondary | ICD-10-CM | POA: Diagnosis not present

## 2021-04-11 DIAGNOSIS — H26492 Other secondary cataract, left eye: Secondary | ICD-10-CM | POA: Diagnosis not present

## 2021-05-11 DIAGNOSIS — L821 Other seborrheic keratosis: Secondary | ICD-10-CM | POA: Diagnosis not present

## 2021-05-11 DIAGNOSIS — L814 Other melanin hyperpigmentation: Secondary | ICD-10-CM | POA: Diagnosis not present

## 2021-05-11 DIAGNOSIS — D225 Melanocytic nevi of trunk: Secondary | ICD-10-CM | POA: Diagnosis not present

## 2021-05-11 DIAGNOSIS — L718 Other rosacea: Secondary | ICD-10-CM | POA: Diagnosis not present

## 2021-05-11 DIAGNOSIS — L570 Actinic keratosis: Secondary | ICD-10-CM | POA: Diagnosis not present

## 2021-05-25 ENCOUNTER — Ambulatory Visit: Payer: PPO | Admitting: Podiatry

## 2021-05-25 ENCOUNTER — Encounter: Payer: Self-pay | Admitting: Podiatry

## 2021-05-25 ENCOUNTER — Ambulatory Visit (INDEPENDENT_AMBULATORY_CARE_PROVIDER_SITE_OTHER): Payer: PPO

## 2021-05-25 ENCOUNTER — Other Ambulatory Visit: Payer: Self-pay

## 2021-05-25 DIAGNOSIS — M722 Plantar fascial fibromatosis: Secondary | ICD-10-CM

## 2021-05-25 MED ORDER — TRIAMCINOLONE ACETONIDE 10 MG/ML IJ SUSP
10.0000 mg | Freq: Once | INTRAMUSCULAR | Status: AC
  Administered 2021-05-25: 10 mg

## 2021-05-25 NOTE — Progress Notes (Signed)
Subjective:   Patient ID: Jeffrey Jimenez, male   DOB: 70 y.o.   MRN: [id number]   HPI Patient presents stating he is getting a lot of pain in the right heel and states its just started in the last month.  Does not remember specific injury but states its been sore and hard to walk on neuro   ROS      Objective:  Physical Exam  Vascular status intact with acute discomfort plantar fascial band right at the insertional point tendon into the calcaneus     Assessment:  Acute Planter fasciitis right insertional point tendon calcaneus     Plan:  H&P x-ray reviewed sterile prep injected the fascia right 3 mg Kenalog 5 mg Xylocaine and instructed on supportive therapy anti-inflammatories reappoint if symptoms indicate  X-rays indicate spur no indication stress fracture arthritis

## 2021-06-05 ENCOUNTER — Other Ambulatory Visit: Payer: Self-pay

## 2021-06-05 ENCOUNTER — Ambulatory Visit: Payer: PPO | Admitting: Podiatry

## 2021-06-05 DIAGNOSIS — Z1389 Encounter for screening for other disorder: Secondary | ICD-10-CM | POA: Diagnosis not present

## 2021-06-05 DIAGNOSIS — Z0000 Encounter for general adult medical examination without abnormal findings: Secondary | ICD-10-CM | POA: Diagnosis not present

## 2021-06-05 DIAGNOSIS — Z131 Encounter for screening for diabetes mellitus: Secondary | ICD-10-CM | POA: Diagnosis not present

## 2021-06-05 DIAGNOSIS — M722 Plantar fascial fibromatosis: Secondary | ICD-10-CM | POA: Diagnosis not present

## 2021-06-05 DIAGNOSIS — Z125 Encounter for screening for malignant neoplasm of prostate: Secondary | ICD-10-CM | POA: Diagnosis not present

## 2021-06-05 DIAGNOSIS — I471 Supraventricular tachycardia: Secondary | ICD-10-CM | POA: Diagnosis not present

## 2021-06-05 DIAGNOSIS — Z23 Encounter for immunization: Secondary | ICD-10-CM | POA: Diagnosis not present

## 2021-06-05 MED ORDER — TRIAMCINOLONE ACETONIDE 10 MG/ML IJ SUSP
10.0000 mg | Freq: Once | INTRAMUSCULAR | Status: AC
  Administered 2021-06-05: 10 mg

## 2021-06-05 NOTE — Progress Notes (Signed)
Subjective:   Patient ID: Jeffrey Jimenez, male   DOB: 70 y.o.   MRN: [id number]   HPI Patient presents with a lot of pain plantar aspect right heel and states there are simply cannot do the types of activities he likes to do including pickleball and he does do a lot of tree farming   ROS      Objective:  Physical Exam  Neurovascular status intact with patient found to have discomfort in the right heel that still quite intense and inflated for acute hard for him to be active     Assessment:  Acute plantar fasciitis right not doing well at the current time     Plan:  H&P reviewed condition and I went ahead today and I discussed immobilization to try to reduce all stress on his heel and patient is placed in an air fracture walker.  I then went ahead did sterile prep and injected the fascia at its insertion 3 mg Kenalog 5 mg Xylocaine applied sterile dressing and reappoint to recheck

## 2021-06-07 ENCOUNTER — Ambulatory Visit: Payer: PPO | Admitting: Podiatry

## 2021-06-19 DIAGNOSIS — J069 Acute upper respiratory infection, unspecified: Secondary | ICD-10-CM | POA: Diagnosis not present

## 2021-08-10 DIAGNOSIS — H57813 Brow ptosis, bilateral: Secondary | ICD-10-CM | POA: Diagnosis not present

## 2021-08-10 DIAGNOSIS — H02831 Dermatochalasis of right upper eyelid: Secondary | ICD-10-CM | POA: Diagnosis not present

## 2021-08-10 DIAGNOSIS — H02413 Mechanical ptosis of bilateral eyelids: Secondary | ICD-10-CM | POA: Diagnosis not present

## 2021-08-10 DIAGNOSIS — H0279 Other degenerative disorders of eyelid and periocular area: Secondary | ICD-10-CM | POA: Diagnosis not present

## 2021-08-10 DIAGNOSIS — H02834 Dermatochalasis of left upper eyelid: Secondary | ICD-10-CM | POA: Diagnosis not present

## 2021-08-10 DIAGNOSIS — H53483 Generalized contraction of visual field, bilateral: Secondary | ICD-10-CM | POA: Diagnosis not present

## 2021-08-15 DIAGNOSIS — H53483 Generalized contraction of visual field, bilateral: Secondary | ICD-10-CM | POA: Diagnosis not present

## 2021-09-26 DIAGNOSIS — Z01810 Encounter for preprocedural cardiovascular examination: Secondary | ICD-10-CM | POA: Diagnosis not present

## 2021-10-09 DIAGNOSIS — H02413 Mechanical ptosis of bilateral eyelids: Secondary | ICD-10-CM | POA: Diagnosis not present

## 2021-10-09 DIAGNOSIS — H53453 Other localized visual field defect, bilateral: Secondary | ICD-10-CM | POA: Diagnosis not present

## 2021-10-09 DIAGNOSIS — H02834 Dermatochalasis of left upper eyelid: Secondary | ICD-10-CM | POA: Diagnosis not present

## 2021-10-09 DIAGNOSIS — H53483 Generalized contraction of visual field, bilateral: Secondary | ICD-10-CM | POA: Diagnosis not present

## 2021-10-09 DIAGNOSIS — H02831 Dermatochalasis of right upper eyelid: Secondary | ICD-10-CM | POA: Diagnosis not present

## 2021-10-09 DIAGNOSIS — H57813 Brow ptosis, bilateral: Secondary | ICD-10-CM | POA: Diagnosis not present

## 2021-10-28 ENCOUNTER — Encounter (HOSPITAL_COMMUNITY): Payer: Self-pay | Admitting: *Deleted

## 2021-10-28 ENCOUNTER — Emergency Department (HOSPITAL_COMMUNITY)
Admission: EM | Admit: 2021-10-28 | Discharge: 2021-10-28 | Disposition: A | Discharge status: Home / Self Care | Payer: PPO | Attending: Emergency Medicine | Admitting: Emergency Medicine

## 2021-10-28 ENCOUNTER — Other Ambulatory Visit: Payer: Self-pay

## 2021-10-28 ENCOUNTER — Emergency Department (HOSPITAL_COMMUNITY): Payer: PPO

## 2021-10-28 DIAGNOSIS — J984 Other disorders of lung: Secondary | ICD-10-CM | POA: Diagnosis not present

## 2021-10-28 DIAGNOSIS — Z7982 Long term (current) use of aspirin: Secondary | ICD-10-CM | POA: Insufficient documentation

## 2021-10-28 DIAGNOSIS — K30 Functional dyspepsia: Secondary | ICD-10-CM | POA: Insufficient documentation

## 2021-10-28 DIAGNOSIS — Z8719 Personal history of other diseases of the digestive system: Secondary | ICD-10-CM | POA: Diagnosis not present

## 2021-10-28 DIAGNOSIS — R079 Chest pain, unspecified: Secondary | ICD-10-CM

## 2021-10-28 DIAGNOSIS — R0789 Other chest pain: Secondary | ICD-10-CM | POA: Insufficient documentation

## 2021-10-28 LAB — CBC
HCT: 40.2 % (ref 39.0–52.0)
Hemoglobin: 14.3 g/dL (ref 13.0–17.0)
MCH: 31.8 pg (ref 26.0–34.0)
MCHC: 35.6 g/dL (ref 30.0–36.0)
MCV: 89.5 fL (ref 80.0–100.0)
Platelets: 165 10*3/uL (ref 150–400)
RBC: 4.49 MIL/uL (ref 4.22–5.81)
RDW: 12.5 % (ref 11.5–15.5)
WBC: 5.9 10*3/uL (ref 4.0–10.5)
nRBC: 0 % (ref 0.0–0.2)

## 2021-10-28 LAB — BASIC METABOLIC PANEL
Anion gap: 5 (ref 5–15)
BUN: 13 mg/dL (ref 8–23)
CO2: 26 mmol/L (ref 22–32)
Calcium: 8.9 mg/dL (ref 8.9–10.3)
Chloride: 108 mmol/L (ref 98–111)
Creatinine, Ser: 1.19 mg/dL (ref 0.61–1.24)
GFR, Estimated: >60 mL/min (ref >60)
Glucose, Bld: 94 mg/dL (ref 70–99)
Potassium: 3.8 mmol/L (ref 3.5–5.1)
Sodium: 139 mmol/L (ref 135–145)

## 2021-10-28 LAB — TROPONIN I (HIGH SENSITIVITY)
Troponin I (High Sensitivity): 7 ng/L (ref <18)
Troponin I (High Sensitivity): 7 ng/L (ref <18)

## 2021-10-28 NOTE — ED Notes (Signed)
Pt resting in bed, placed on cardiac monitoring. Pt continues to rate chest pain 3/10. States it happened yesterday as well but had improved  ?

## 2021-10-28 NOTE — Discharge Instructions (Addendum)
You were seen in the emergency department today for chest pain. ? ?As we discussed all your lab work and imaging has looked very reassuring today.  I think your symptoms could have been related to reflux/indigestion.  I recommend you take a medicine like Prilosec or Pepcid about 30 minutes prior to eating dinner to reduce the acid levels in your stomach before going to bed.  ? ?I have sent a message to your cardiologist.  And I always recommend that you follow-up with your primary doctor within a week of having been seen in the ER. ?

## 2021-10-28 NOTE — ED Triage Notes (Signed)
C/o indigestion onset x 2 days states it gets better when he moves around. States he play pickle ball ?

## 2021-10-28 NOTE — ED Provider Notes (Signed)
?[place] ?Provider Note ? ? ?CSN: [id number] ?Arrival date & time: 10/28/21  4163 ? ?  ? ?History ? ?Chief Complaint  ?Patient presents with  ? Chest Pain  ? ? ?Jeffrey Jimenez is a 71 y.o. male. ? ? ?Chest Pain ?Associated symptoms: no cough, no dizziness, no fever, no headache, no nausea, no shortness of breath, no vomiting and no weakness   ? ?HPI: A 71 year old patient presents for evaluation of chest pain. Initial onset of pain was approximately 3-6 hours ago. The patient's chest pain is sharp and is not worse with exertion. The patient's chest pain is middle- or left-sided, is not well-localized, is not described as heaviness/pressure/tightness and does not radiate to the arms/jaw/neck. The patient does not complain of nausea and denies diaphoresis. The patient has no history of stroke, has no history of peripheral artery disease, has not smoked in the past 90 days, denies any history of treated diabetes, has no relevant family history of coronary artery disease (first degree relative at less than age 50), is not hypertensive, has no history of hypercholesterolemia and does not have an elevated BMI (>=30).  He has no active chest pain at this time.  He states that when the pain has come on the past 2 mornings, it goes away on its own once he gets up and gets ready for the day.  He is able to play pickle ball throughout the day with no recurrence or worsening of symptoms.  He has a history of paroxysmal SVT, had an ablation in 2019, and since then has had no recurrence of symptoms. ? ?Past Medical History:  ?Diagnosis Date  ? Chest pain   ? Nuc stress test 05/31/09 showed no evidence of ischemia, normal EF & no significant changes from previous study in 07/26/05. ECHO 05/31/09 showed normal EF & mild MR.  ? Diverticulosis   ? Found by colonoscopy in 2010  ? History of exercise stress test   ? GXT 6/19:  Normal   ? Hx of colonic polyps   ? colonoscopy in 2007  ? IBS  (irritable bowel syndrome)   ? Paroxysmal supraventricular tachycardia ([place])   ? Diltiazem PRN. Nuc stress test 05/31/09 showed no evidence of ischemia, normal EF & no significant changes from previous study in 07/26/05. ECHO 05/31/09 showed normal EF & mild MR.  ? Rosacea   ? SVT (supraventricular tachycardia) ([place])   ? ? ?Home Medications ?Prior to Admission medications   ?Medication Sig Start Date End Date Taking? Authorizing Provider  ?aspirin 81 MG tablet Take 81 mg by mouth daily.    [provider]  ?   ? ?Allergies    ?Patient has no known allergies.   ? ?Review of Systems   ?Review of Systems  ?Constitutional:  Negative for fever.  ?Respiratory:  Negative for cough and shortness of breath.   ?Cardiovascular:  Positive for chest pain.  ?Gastrointestinal:  Negative for nausea and vomiting.  ?Neurological:  Negative for dizziness, syncope, weakness and headaches.  ?     1 episode of left arm numbness  ?All other systems reviewed and are negative. ? ?Physical Exam ?Updated Vital Signs ?BP 122/83   Pulse 60   Temp 98.5 ?F (36.9 ?C) (Oral)   Resp 20   Ht 6' (1.829 m)   Wt 83.9 kg   SpO2 98%   BMI 25.09 kg/m?  ?Physical Exam ?Vitals and nursing note reviewed.  ?Constitutional:   ?  Appearance: Normal appearance.  ?HENT:  ?   Head: Normocephalic and atraumatic.  ?Eyes:  ?   Conjunctiva/sclera: Conjunctivae normal.  ?Cardiovascular:  ?   Rate and Rhythm: Normal rate and regular rhythm.  ?Pulmonary:  ?   Effort: Pulmonary effort is normal. No respiratory distress.  ?   Breath sounds: Normal breath sounds.  ?Abdominal:  ?   General: There is no distension.  ?   Palpations: Abdomen is soft.  ?   Tenderness: There is no abdominal tenderness.  ?Musculoskeletal:  ?   Right lower leg: No edema.  ?   Left lower leg: No edema.  ?Skin: ?   General: Skin is warm and dry.  ?Neurological:  ?   General: No focal deficit present.  ?   Mental Status: He is alert.  ? ? ?ED Results / Procedures / Treatments    ?Labs ?(all labs ordered are listed, but only abnormal results are displayed) ?Labs Reviewed  ?BASIC METABOLIC PANEL  ?CBC  ?TROPONIN I (HIGH SENSITIVITY)  ?TROPONIN I (HIGH SENSITIVITY)  ? ? ?EKG ?EKG Interpretation ? ?Date/Time:  Saturday October 28 2021 06:41:21 EDT ?Ventricular Rate:  74 ?PR Interval:  172 ?QRS Duration: 92 ?QT Interval:  392 ?QTC Calculation: 435 ?R Axis:   47 ?Text Interpretation: Normal sinus rhythm Normal ECG When compared with ECG of 23-Jun-2018 14:56, No significant change since last tracing Confirmed by Dorie Rank [phone]) on 10/28/2021 8:58:20 AM ? ?Radiology ?DG Chest 2 View ? ?Result Date: 10/28/2021 ?CLINICAL DATA:  Indigestion for 2 days EXAM: CHEST - 2 VIEW COMPARISON:  01/08/2018 FINDINGS: Normal heart size and mediastinal contours. No acute infiltrate or edema. Rounded nodular density over the anterior heart on the lateral view measuring 9 mm, favor chondral calcification but not seen on prior. No effusion or pneumothorax. No acute osseous findings. IMPRESSION: 9 mm nodular density over the anterior chest on the lateral view, possibly chondral calcification given the density but not seen on prior. Suggest non emergent chest CT. No acute finding. Electronically Signed   By: Jorje Guild M.D.   On: 10/28/2021 07:38   ? ?Procedures ?Procedures  ? ? ?Medications Ordered in ED ?Medications - No data to display ? ?ED Course/ Medical Decision Making/ A&P ?  ?HEAR Score: 2                       ?Medical Decision Making ?Amount and/or Complexity of Data Reviewed ?Labs: ordered. ?Radiology: ordered. ? ? ?This patient presents to the ED for concern of chest pain, this involves an extensive number of treatment options, and is a complaint that carries with it a high risk of complications and morbidity. The emergent differential diagnosis prior to evaluation includes, but is not limited to,  ACS, pericarditis, aortic dissection, PE, pneumothorax, esophageal spasm or rupture, chronic angina,  valvular disease, cardiomyopathy, myocarditis, pulmonary HTN, pneumonia, bronchitis, GERD, reflux/PUD, biliary disease, pancreatitis, disk disease or arthritis, costochondritis, anxiety or panic attack, herpes zoster, breast disorders, chest wall tumors.  ? ?This is not an exhaustive differential.  ? ?Past Medical History / Co-morbidities / Social History: ?IBS, diverticulosis, paroxysmal SVT ? ?Additional history: ?Additional history obtained from chart review. External records from outside source obtained and reviewed including procedural notes from when patient had a stress test and echocardiogram performed in 2010 that showed no evidence of ischemia with normal ejection fraction and mild mitral regurgitation. ? ?Physical Exam: ?Physical exam performed. The pertinent findings include: Patient is afebrile, not  tachycardic, not hypoxic, no acute distress. ? ?Lab Tests: ?I ordered, and personally interpreted labs.  The pertinent results include: No leukocytosis, normal hemoglobin.  Electrolytes within normal limits.  Initial troponin of 7, delta troponin of 7.  ?  ?Imaging Studies: ?I ordered imaging studies including chest x-ray. I independently visualized and interpreted imaging which showed no acute cardiopulmonary abnormalities. I agree with the radiologist interpretation. ?  ?Cardiac Monitoring:  ?The patient was maintained on a cardiac monitor.  My attending physician Dr. Tomi Bamberger viewed and interpreted the cardiac monitored which showed an underlying rhythm of: normal sinus rhythm. I agree with this interpretation. ?  ?Disposition: ?After consideration of the diagnostic results and the patients response to treatment, I feel that patient is not requiring admission or inpatient treatment for his symptoms.  Patient is to be discharged with recommendation to follow up with PCP in regards to today's hospital visit. Chest pain is not likely of cardiac or pulmonary etiology d/t presentation, Well's criteria negative  for PE, VSS, no tracheal deviation, no JVD or new murmur, RRR, breath sounds equal bilaterally, EKG without acute abnormalities, negative troponin, and negative CXR. Heart score for major cardiac events = 2. Pt has been adv

## 2021-11-07 DIAGNOSIS — J069 Acute upper respiratory infection, unspecified: Secondary | ICD-10-CM | POA: Diagnosis not present

## 2021-12-11 DIAGNOSIS — Z01818 Encounter for other preprocedural examination: Secondary | ICD-10-CM | POA: Diagnosis not present

## 2021-12-11 DIAGNOSIS — Z1389 Encounter for screening for other disorder: Secondary | ICD-10-CM | POA: Diagnosis not present

## 2021-12-12 ENCOUNTER — Other Ambulatory Visit: Payer: Self-pay | Admitting: Family Medicine

## 2021-12-12 DIAGNOSIS — R9389 Abnormal findings on diagnostic imaging of other specified body structures: Secondary | ICD-10-CM

## 2021-12-18 ENCOUNTER — Ambulatory Visit (INDEPENDENT_AMBULATORY_CARE_PROVIDER_SITE_OTHER): Payer: PPO

## 2021-12-18 DIAGNOSIS — I2510 Atherosclerotic heart disease of native coronary artery without angina pectoris: Secondary | ICD-10-CM | POA: Diagnosis not present

## 2021-12-18 DIAGNOSIS — J984 Other disorders of lung: Secondary | ICD-10-CM | POA: Diagnosis not present

## 2021-12-18 DIAGNOSIS — I700 Atherosclerosis of aorta: Secondary | ICD-10-CM | POA: Diagnosis not present

## 2021-12-18 DIAGNOSIS — R9389 Abnormal findings on diagnostic imaging of other specified body structures: Secondary | ICD-10-CM | POA: Diagnosis not present

## 2021-12-18 MED ORDER — IOHEXOL 300 MG/ML  SOLN
100.0000 mL | Freq: Once | INTRAMUSCULAR | Status: AC | PRN
  Administered 2021-12-18: 75 mL via INTRAVENOUS

## 2021-12-27 ENCOUNTER — Other Ambulatory Visit: Payer: PPO

## 2022-03-27 DIAGNOSIS — D225 Melanocytic nevi of trunk: Secondary | ICD-10-CM | POA: Diagnosis not present

## 2022-03-27 DIAGNOSIS — D485 Neoplasm of uncertain behavior of skin: Secondary | ICD-10-CM | POA: Diagnosis not present

## 2022-03-27 DIAGNOSIS — L814 Other melanin hyperpigmentation: Secondary | ICD-10-CM | POA: Diagnosis not present

## 2022-03-27 DIAGNOSIS — L570 Actinic keratosis: Secondary | ICD-10-CM | POA: Diagnosis not present

## 2022-03-27 DIAGNOSIS — L821 Other seborrheic keratosis: Secondary | ICD-10-CM | POA: Diagnosis not present

## 2022-04-17 DIAGNOSIS — H5213 Myopia, bilateral: Secondary | ICD-10-CM | POA: Diagnosis not present

## 2022-04-17 DIAGNOSIS — H26493 Other secondary cataract, bilateral: Secondary | ICD-10-CM | POA: Diagnosis not present

## 2022-04-17 DIAGNOSIS — H52203 Unspecified astigmatism, bilateral: Secondary | ICD-10-CM | POA: Diagnosis not present

## 2022-04-17 DIAGNOSIS — H524 Presbyopia: Secondary | ICD-10-CM | POA: Diagnosis not present

## 2022-04-17 DIAGNOSIS — H43813 Vitreous degeneration, bilateral: Secondary | ICD-10-CM | POA: Diagnosis not present

## 2022-05-12 DIAGNOSIS — J302 Other seasonal allergic rhinitis: Secondary | ICD-10-CM | POA: Diagnosis not present

## 2022-05-15 IMAGING — CT CT CHEST W/ CM
2 of 4 series · 15 of 36 positions shown, 18 images · IV contrast (agent unspecified)
Comparison: Chest radiograph dated 10/28/2021.

CLINICAL DATA: Nodular density seen on chest radiograph.

EXAM:
CT CHEST WITH CONTRAST
TECHNIQUE: Multidetector CT imaging of the chest was performed during
intravenous contrast administration.

[Series 2: axial st · axial · 0.72mm/px · z∈[-290,-24]mm · 12 of 159 slices shown, 15 images]
[im 13/159  mediastinal]
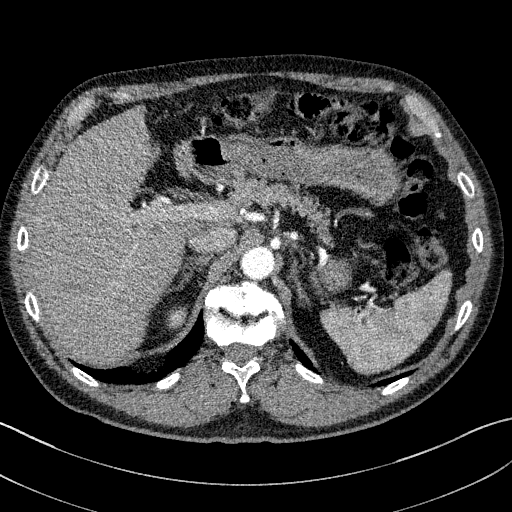
[im 13/159  lung]
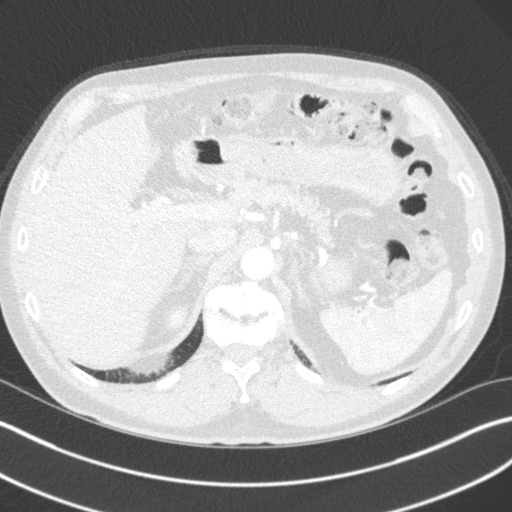
[im 25/159  lung]
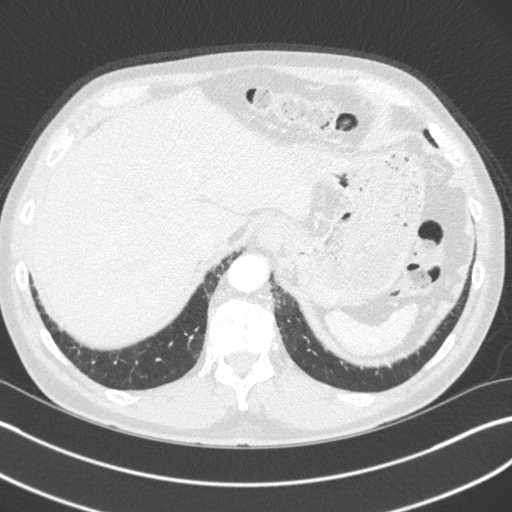
[im 37/159  lung]
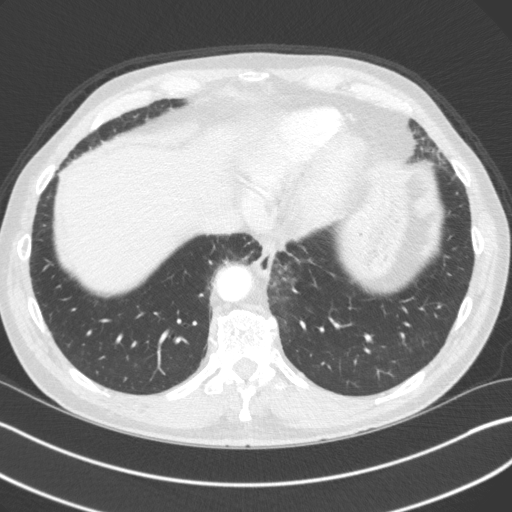
[im 49/159  lung]
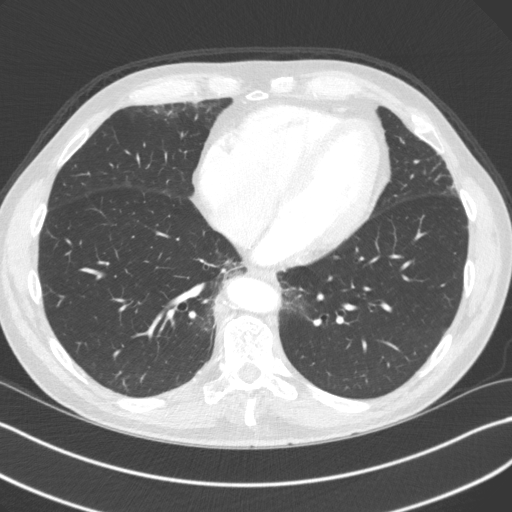
[im 61/159  mediastinal]
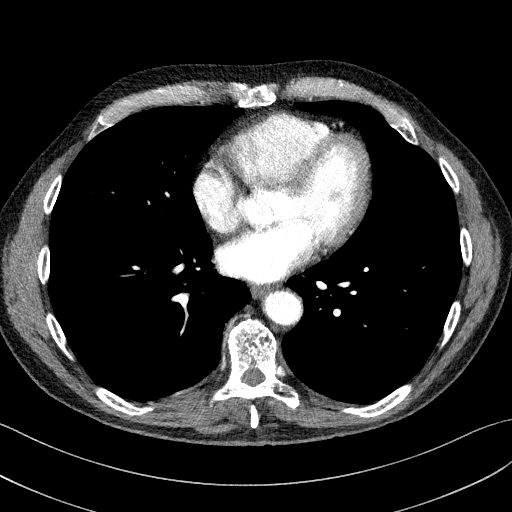
[im 61/159  lung]
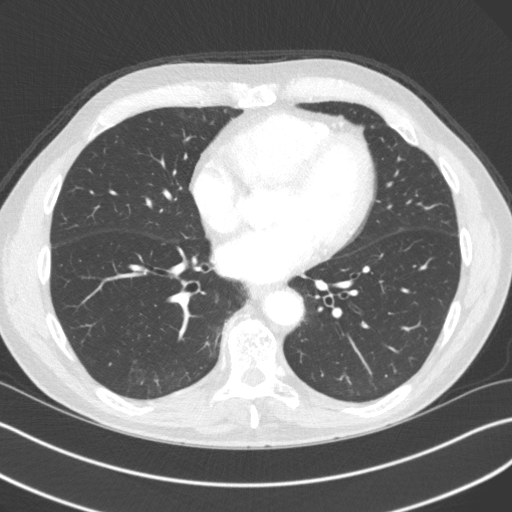
[im 73/159  lung]
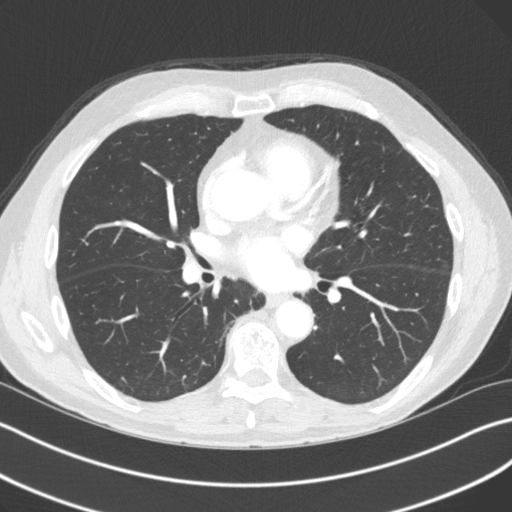
[im 86/159  lung]
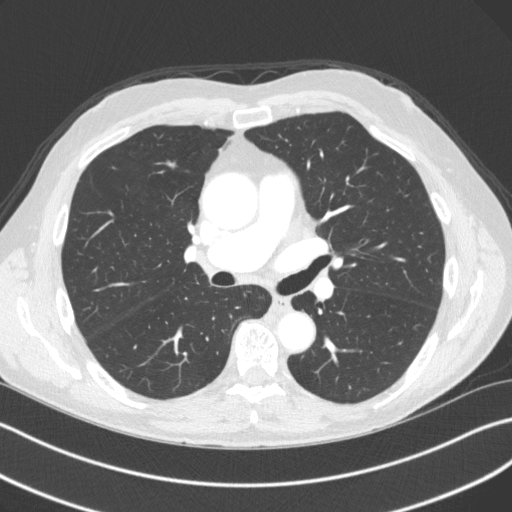
[im 98/159  lung]
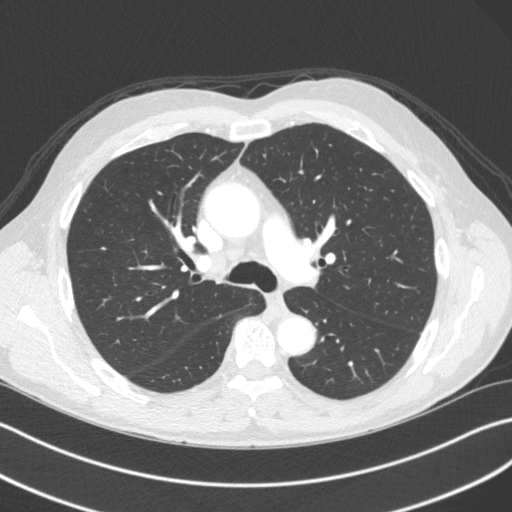
[im 110/159  mediastinal]
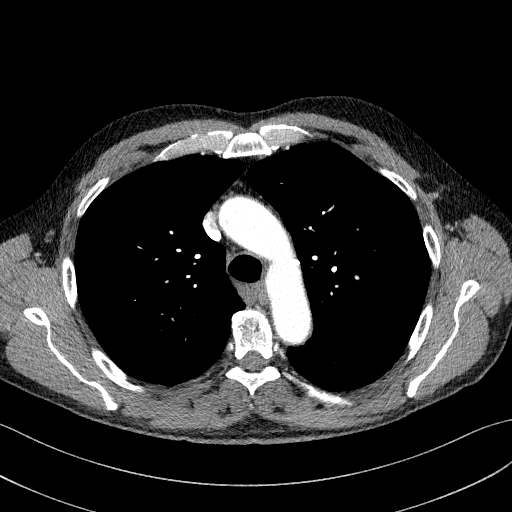
[im 110/159  lung]
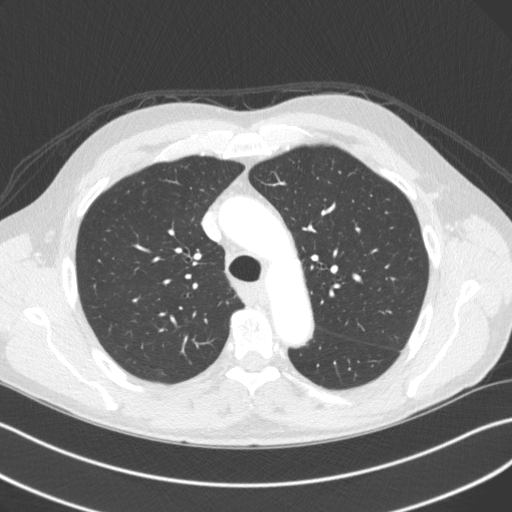
[im 122/159  lung]
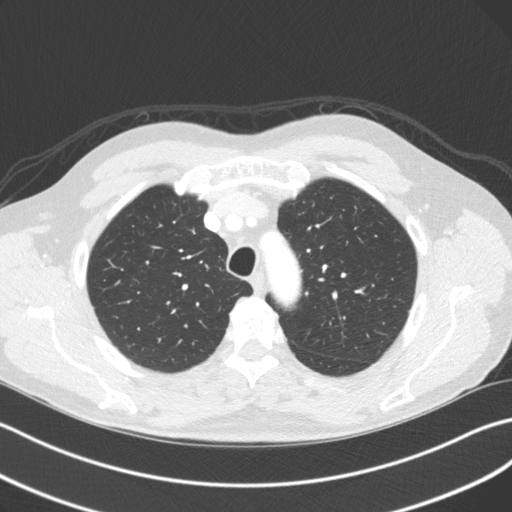
[im 134/159  lung]
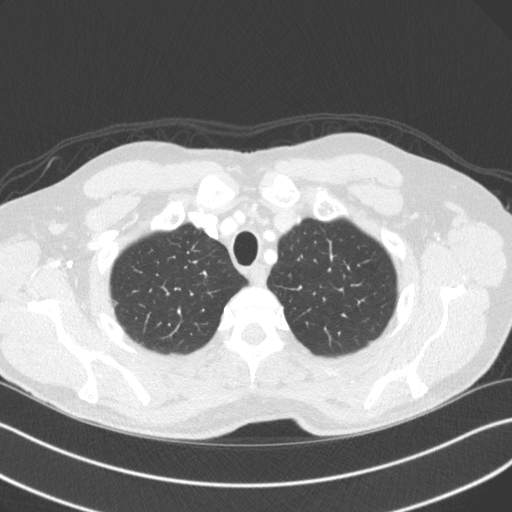
[im 146/159  lung]
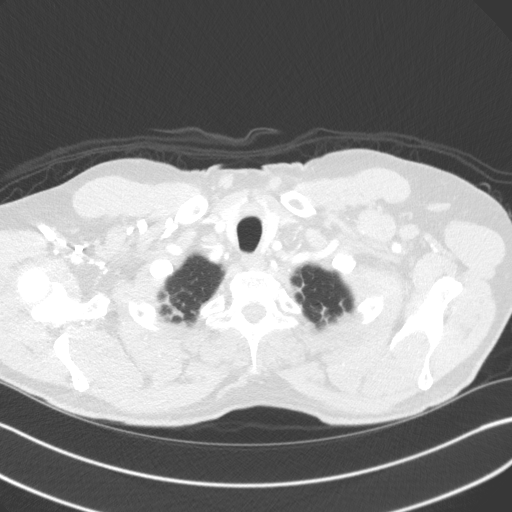

[Series 5: coronal · coronal · 0.62mm/px · 3 of 133 slices shown]
[im 27/133  lung]
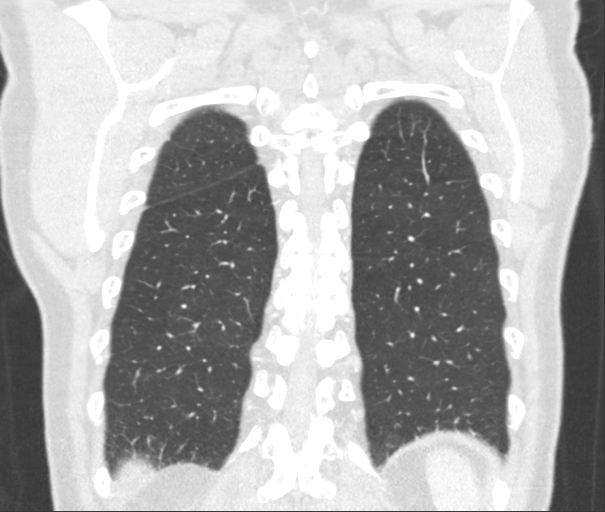
[im 53/133  lung]
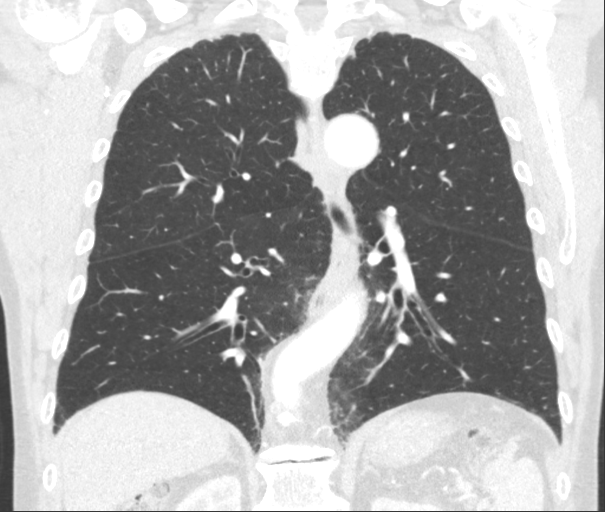
[im 80/133  lung]
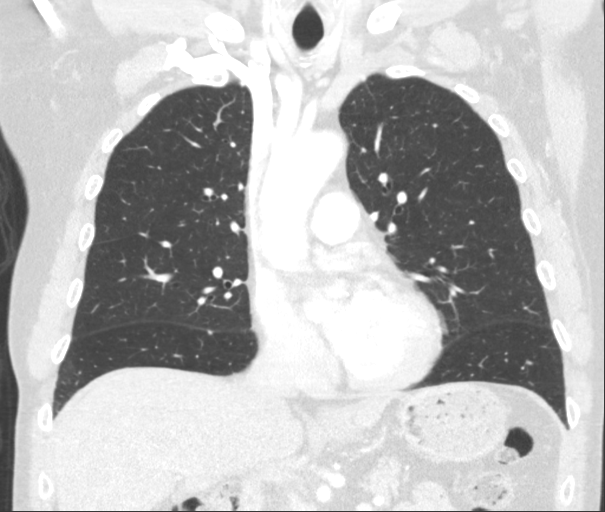

[15 of 36 positions shown; findings below may reference images not displayed]

RADIATION DOSE REDUCTION: This exam was performed according to the
departmental dose-optimization program which includes automated
exposure control, adjustment of the mA and/or kV according to
patient size and/or use of iterative reconstruction technique.

CONTRAST:  75mL OMNIPAQUE IOHEXOL 300 MG/ML  SOLN
FINDINGS: Cardiovascular: There is no cardiomegaly or pericardial effusion.
Mild atherosclerotic calcification of the aortic arch. No aneurysmal
dilatation or dissection. The origins of the great vessels of the
aortic arch appear patent. No pulmonary artery embolus identified.

Mediastinum/Nodes: No hilar or mediastinal adenopathy. The esophagus
is grossly unremarkable. No mediastinal fluid collection.

Lungs/Pleura: The lungs are clear. There is no pleural effusion or
pneumothorax. No nodule identified to correspond to the density seen
on the radiograph. The central airways are patent.

Upper Abdomen: No acute abnormality.

Musculoskeletal: Osteopenia with degenerative changes of the spine.
No acute osseous pathology.
IMPRESSION: 1. No acute intrathoracic pathology. No nodule identified to
correspond to the density seen on the radiograph.
2. Aortic Atherosclerosis ([id number]).

## 2022-05-21 DIAGNOSIS — J309 Allergic rhinitis, unspecified: Secondary | ICD-10-CM | POA: Diagnosis not present

## 2022-06-06 DIAGNOSIS — Z136 Encounter for screening for cardiovascular disorders: Secondary | ICD-10-CM | POA: Diagnosis not present

## 2022-06-06 DIAGNOSIS — Z1331 Encounter for screening for depression: Secondary | ICD-10-CM | POA: Diagnosis not present

## 2022-06-06 DIAGNOSIS — Z131 Encounter for screening for diabetes mellitus: Secondary | ICD-10-CM | POA: Diagnosis not present

## 2022-06-06 DIAGNOSIS — Z125 Encounter for screening for malignant neoplasm of prostate: Secondary | ICD-10-CM | POA: Diagnosis not present

## 2022-06-06 DIAGNOSIS — J309 Allergic rhinitis, unspecified: Secondary | ICD-10-CM | POA: Diagnosis not present

## 2022-06-06 DIAGNOSIS — I471 Supraventricular tachycardia, unspecified: Secondary | ICD-10-CM | POA: Diagnosis not present

## 2022-06-06 DIAGNOSIS — Z0000 Encounter for general adult medical examination without abnormal findings: Secondary | ICD-10-CM | POA: Diagnosis not present

## 2022-07-02 DIAGNOSIS — R972 Elevated prostate specific antigen [PSA]: Secondary | ICD-10-CM | POA: Diagnosis not present

## 2022-07-10 DIAGNOSIS — L578 Other skin changes due to chronic exposure to nonionizing radiation: Secondary | ICD-10-CM | POA: Diagnosis not present

## 2022-07-10 DIAGNOSIS — L570 Actinic keratosis: Secondary | ICD-10-CM | POA: Diagnosis not present

## 2022-08-10 DIAGNOSIS — H00015 Hordeolum externum left lower eyelid: Secondary | ICD-10-CM | POA: Diagnosis not present

## 2022-08-14 DIAGNOSIS — L570 Actinic keratosis: Secondary | ICD-10-CM | POA: Diagnosis not present

## 2022-08-14 DIAGNOSIS — L578 Other skin changes due to chronic exposure to nonionizing radiation: Secondary | ICD-10-CM | POA: Diagnosis not present

## 2022-08-31 ENCOUNTER — Telehealth: Payer: Self-pay

## 2022-08-31 NOTE — Patient Outreach (Signed)
  Care Coordination   08/31/2022 Name: Jeffrey Jimenez MRN: [id number] DOB: 1950/08/25   Care Coordination Outreach Attempts:  An unsuccessful telephone outreach was attempted today to offer the patient information about available care coordination services as a benefit of their health plan.   Follow Up Plan:  Additional outreach attempts will be made to offer the patient care coordination information and services.   Encounter Outcome:  No Answer   Care Coordination Interventions:  No, not indicated    Peter Garter RN, BSN,CCM, CDE Care Management Coordinator [facility] [phone]

## 2022-09-12 ENCOUNTER — Telehealth: Payer: Self-pay

## 2022-09-12 DIAGNOSIS — L570 Actinic keratosis: Secondary | ICD-10-CM | POA: Diagnosis not present

## 2022-09-12 DIAGNOSIS — L578 Other skin changes due to chronic exposure to nonionizing radiation: Secondary | ICD-10-CM | POA: Diagnosis not present

## 2022-09-12 NOTE — Patient Outreach (Signed)
  Care Coordination   09/12/2022 Name: Jeffrey Jimenez MRN: [id number] DOB: 24-Jul-1951   Care Coordination Outreach Attempts:  A second unsuccessful outreach was attempted today to offer the patient with information about available care coordination services as a benefit of their health plan.     Follow Up Plan:  Additional outreach attempts will be made to offer the patient care coordination information and services.   Encounter Outcome:  No Answer   Care Coordination Interventions:  No, not indicated   Peter Garter RN, BSN,CCM, CDE Care Management Coordinator [facility] [phone]

## 2022-09-19 ENCOUNTER — Telehealth: Payer: Self-pay

## 2022-09-19 NOTE — Patient Outreach (Signed)
  Care Coordination   09/19/2022 Name: Jeffrey Jimenez MRN: [id number] DOB: 1951-05-13   Care Coordination Outreach Attempts:  A third unsuccessful outreach was attempted today to offer the patient with information about available care coordination services as a benefit of their health plan.   Follow Up Plan:  No further outreach attempts will be made at this time. We have been unable to contact the patient to offer or enroll patient in care coordination services  Encounter Outcome:  No Answer   Care Coordination Interventions:  No, not indicated    Peter Garter RN, BSN,CCM, [facility] [phone]

## 2022-10-17 DIAGNOSIS — L821 Other seborrheic keratosis: Secondary | ICD-10-CM | POA: Diagnosis not present

## 2022-10-17 DIAGNOSIS — L814 Other melanin hyperpigmentation: Secondary | ICD-10-CM | POA: Diagnosis not present

## 2022-10-17 DIAGNOSIS — D225 Melanocytic nevi of trunk: Secondary | ICD-10-CM | POA: Diagnosis not present

## 2022-10-17 DIAGNOSIS — L570 Actinic keratosis: Secondary | ICD-10-CM | POA: Diagnosis not present

## 2022-10-17 DIAGNOSIS — L820 Inflamed seborrheic keratosis: Secondary | ICD-10-CM | POA: Diagnosis not present

## 2023-03-21 DIAGNOSIS — J309 Allergic rhinitis, unspecified: Secondary | ICD-10-CM | POA: Diagnosis not present

## 2023-04-23 DIAGNOSIS — H524 Presbyopia: Secondary | ICD-10-CM | POA: Diagnosis not present

## 2023-04-23 DIAGNOSIS — H43812 Vitreous degeneration, left eye: Secondary | ICD-10-CM | POA: Diagnosis not present

## 2023-04-23 DIAGNOSIS — H5213 Myopia, bilateral: Secondary | ICD-10-CM | POA: Diagnosis not present

## 2023-04-23 DIAGNOSIS — H04123 Dry eye syndrome of bilateral lacrimal glands: Secondary | ICD-10-CM | POA: Diagnosis not present

## 2023-04-23 DIAGNOSIS — H26493 Other secondary cataract, bilateral: Secondary | ICD-10-CM | POA: Diagnosis not present

## 2023-04-29 DIAGNOSIS — J069 Acute upper respiratory infection, unspecified: Secondary | ICD-10-CM | POA: Diagnosis not present

## 2023-05-22 DIAGNOSIS — L570 Actinic keratosis: Secondary | ICD-10-CM | POA: Diagnosis not present

## 2023-05-22 DIAGNOSIS — L218 Other seborrheic dermatitis: Secondary | ICD-10-CM | POA: Diagnosis not present

## 2023-05-22 DIAGNOSIS — L814 Other melanin hyperpigmentation: Secondary | ICD-10-CM | POA: Diagnosis not present

## 2023-05-22 DIAGNOSIS — L821 Other seborrheic keratosis: Secondary | ICD-10-CM | POA: Diagnosis not present

## 2023-05-22 DIAGNOSIS — L718 Other rosacea: Secondary | ICD-10-CM | POA: Diagnosis not present

## 2023-05-29 DIAGNOSIS — L570 Actinic keratosis: Secondary | ICD-10-CM | POA: Diagnosis not present

## 2023-06-04 DIAGNOSIS — Z125 Encounter for screening for malignant neoplasm of prostate: Secondary | ICD-10-CM | POA: Diagnosis not present

## 2023-06-04 DIAGNOSIS — I471 Supraventricular tachycardia, unspecified: Secondary | ICD-10-CM | POA: Diagnosis not present

## 2023-06-04 DIAGNOSIS — Z13220 Encounter for screening for lipoid disorders: Secondary | ICD-10-CM | POA: Diagnosis not present

## 2023-06-04 DIAGNOSIS — Z136 Encounter for screening for cardiovascular disorders: Secondary | ICD-10-CM | POA: Diagnosis not present

## 2023-06-04 DIAGNOSIS — Z131 Encounter for screening for diabetes mellitus: Secondary | ICD-10-CM | POA: Diagnosis not present

## 2023-06-10 DIAGNOSIS — Z0000 Encounter for general adult medical examination without abnormal findings: Secondary | ICD-10-CM | POA: Diagnosis not present

## 2023-06-10 DIAGNOSIS — Z131 Encounter for screening for diabetes mellitus: Secondary | ICD-10-CM | POA: Diagnosis not present

## 2023-06-10 DIAGNOSIS — I700 Atherosclerosis of aorta: Secondary | ICD-10-CM | POA: Diagnosis not present

## 2023-06-10 DIAGNOSIS — Z9181 History of falling: Secondary | ICD-10-CM | POA: Diagnosis not present

## 2023-06-10 DIAGNOSIS — Z125 Encounter for screening for malignant neoplasm of prostate: Secondary | ICD-10-CM | POA: Diagnosis not present

## 2023-06-10 DIAGNOSIS — Z13220 Encounter for screening for lipoid disorders: Secondary | ICD-10-CM | POA: Diagnosis not present

## 2023-06-27 DIAGNOSIS — L570 Actinic keratosis: Secondary | ICD-10-CM | POA: Diagnosis not present

## 2023-10-04 DIAGNOSIS — R309 Painful micturition, unspecified: Secondary | ICD-10-CM | POA: Diagnosis not present

## 2023-10-22 DIAGNOSIS — R197 Diarrhea, unspecified: Secondary | ICD-10-CM | POA: Diagnosis not present

## 2023-10-25 DIAGNOSIS — A0472 Enterocolitis due to Clostridium difficile, not specified as recurrent: Secondary | ICD-10-CM | POA: Diagnosis not present

## 2023-11-01 DIAGNOSIS — A0472 Enterocolitis due to Clostridium difficile, not specified as recurrent: Secondary | ICD-10-CM | POA: Diagnosis not present

## 2023-11-18 DIAGNOSIS — E860 Dehydration: Secondary | ICD-10-CM | POA: Diagnosis not present

## 2023-11-18 DIAGNOSIS — E46 Unspecified protein-calorie malnutrition: Secondary | ICD-10-CM | POA: Diagnosis not present

## 2023-11-18 DIAGNOSIS — R197 Diarrhea, unspecified: Secondary | ICD-10-CM | POA: Diagnosis not present

## 2023-11-18 DIAGNOSIS — Z8619 Personal history of other infectious and parasitic diseases: Secondary | ICD-10-CM | POA: Diagnosis not present

## 2023-11-19 DIAGNOSIS — R197 Diarrhea, unspecified: Secondary | ICD-10-CM | POA: Diagnosis not present

## 2023-11-25 DIAGNOSIS — E876 Hypokalemia: Secondary | ICD-10-CM | POA: Diagnosis not present

## 2023-12-12 DIAGNOSIS — A0472 Enterocolitis due to Clostridium difficile, not specified as recurrent: Secondary | ICD-10-CM | POA: Diagnosis not present

## 2023-12-12 DIAGNOSIS — Z860100 Personal history of colon polyps, unspecified: Secondary | ICD-10-CM | POA: Diagnosis not present

## 2024-01-08 DIAGNOSIS — Z872 Personal history of diseases of the skin and subcutaneous tissue: Secondary | ICD-10-CM | POA: Diagnosis not present

## 2024-01-08 DIAGNOSIS — D225 Melanocytic nevi of trunk: Secondary | ICD-10-CM | POA: Diagnosis not present

## 2024-01-08 DIAGNOSIS — L718 Other rosacea: Secondary | ICD-10-CM | POA: Diagnosis not present

## 2024-01-08 DIAGNOSIS — L814 Other melanin hyperpigmentation: Secondary | ICD-10-CM | POA: Diagnosis not present

## 2024-01-08 DIAGNOSIS — L570 Actinic keratosis: Secondary | ICD-10-CM | POA: Diagnosis not present

## 2024-01-08 DIAGNOSIS — L218 Other seborrheic dermatitis: Secondary | ICD-10-CM | POA: Diagnosis not present

## 2024-01-08 DIAGNOSIS — L821 Other seborrheic keratosis: Secondary | ICD-10-CM | POA: Diagnosis not present

## 2024-04-23 DIAGNOSIS — H26493 Other secondary cataract, bilateral: Secondary | ICD-10-CM | POA: Diagnosis not present

## 2024-04-23 DIAGNOSIS — H524 Presbyopia: Secondary | ICD-10-CM | POA: Diagnosis not present

## 2024-04-23 DIAGNOSIS — H5213 Myopia, bilateral: Secondary | ICD-10-CM | POA: Diagnosis not present

## 2024-04-23 DIAGNOSIS — H52203 Unspecified astigmatism, bilateral: Secondary | ICD-10-CM | POA: Diagnosis not present

## 2024-04-23 DIAGNOSIS — H43813 Vitreous degeneration, bilateral: Secondary | ICD-10-CM | POA: Diagnosis not present

## 2024-06-16 DIAGNOSIS — E782 Mixed hyperlipidemia: Secondary | ICD-10-CM | POA: Diagnosis not present

## 2024-06-16 DIAGNOSIS — Z1331 Encounter for screening for depression: Secondary | ICD-10-CM | POA: Diagnosis not present

## 2024-06-16 DIAGNOSIS — Z0000 Encounter for general adult medical examination without abnormal findings: Secondary | ICD-10-CM | POA: Diagnosis not present

## 2024-06-16 DIAGNOSIS — I700 Atherosclerosis of aorta: Secondary | ICD-10-CM | POA: Diagnosis not present

## 2024-06-16 DIAGNOSIS — Z125 Encounter for screening for malignant neoplasm of prostate: Secondary | ICD-10-CM | POA: Diagnosis not present

## 2024-06-16 DIAGNOSIS — Z131 Encounter for screening for diabetes mellitus: Secondary | ICD-10-CM | POA: Diagnosis not present

## 2024-06-16 DIAGNOSIS — Z23 Encounter for immunization: Secondary | ICD-10-CM | POA: Diagnosis not present

## 2024-06-16 DIAGNOSIS — Z136 Encounter for screening for cardiovascular disorders: Secondary | ICD-10-CM | POA: Diagnosis not present

## 2024-06-16 DIAGNOSIS — J309 Allergic rhinitis, unspecified: Secondary | ICD-10-CM | POA: Diagnosis not present

## 2024-06-23 DIAGNOSIS — E782 Mixed hyperlipidemia: Secondary | ICD-10-CM | POA: Diagnosis not present

## 2024-06-30 ENCOUNTER — Telehealth: Payer: Self-pay | Admitting: Cardiology

## 2024-06-30 NOTE — Telephone Encounter (Signed)
 Patient states that he is going to [place]  on vacation soon and wants to know if there is any preliminary testing that Dr. Jeffrie would like for him to have to make sure he is safe to travel. Patient hasn't seen Dr. Jeffrie since 2020 and is scheduled to come in 01/09.

## 2024-07-06 NOTE — Telephone Encounter (Signed)
 Jeffrey Oneil BROCKS, MD to [name], RN  Theotis Sharlet PARAS, RN (Selected Message)    07/01/24  3:48 PM Unable to really comment on his fitness for travel since I have not seen him in several years.  Please reach out to primary care physician who has seen him hopefully recently for advisement.  Pt is aware of the above information.   He reports recently seeing his PCP and was just checking to see if he needs EKG or other testing.  Advised pt any testing needed will be ordered at his new pt appt as scheduled since he has not been seen since 2020.

## 2024-08-14 ENCOUNTER — Encounter: Payer: Self-pay | Admitting: Cardiology

## 2024-08-14 ENCOUNTER — Ambulatory Visit: Payer: Self-pay | Attending: Cardiology | Admitting: Cardiology

## 2024-08-14 VITALS — BP 130/82 | HR 73 | Ht 70.0 in | Wt 194.6 lb

## 2024-08-14 DIAGNOSIS — I2489 Other forms of acute ischemic heart disease: Secondary | ICD-10-CM

## 2024-08-14 DIAGNOSIS — I471 Supraventricular tachycardia, unspecified: Secondary | ICD-10-CM | POA: Diagnosis not present

## 2024-08-14 DIAGNOSIS — R072 Precordial pain: Secondary | ICD-10-CM

## 2024-08-14 MED ORDER — METOPROLOL TARTRATE 50 MG PO TABS: 50.0000 mg | ORAL_TABLET | ORAL | 0 refills | Status: AC

## 2024-08-14 NOTE — Patient Instructions (Addendum)
 Medication Instructions:  The current medical regimen is effective;  continue present plan and medications.  *If you need a refill on your cardiac medications before your next appointment, please call your pharmacy*  Testing/Procedures:   Your cardiac CT will be scheduled at:  [facility] [address]  [place], [place] [address]  Please enter the parking lot using the [address] entrance and use the FREE valet service at the patient drop-off area. Enter the building and check-in with registration on the main floor.  Please follow these instructions carefully (unless otherwise directed):  An IV will be required for this test and Nitroglycerin will be given.  Hold all erectile dysfunction medications at least 3 days (72 hrs) prior to test. (Ie viagra, cialis, sildenafil, tadalafil, etc)   On the Night Before the Test: Be sure to Drink plenty of water. Do not consume any caffeinated/decaffeinated beverages or chocolate 12 hours prior to your test. Do not take any antihistamines 12 hours prior to your test.  On the Day of the Test: Drink plenty of water until 1 hour prior to the test. Do not eat any food 1 hour prior to test. You may take your regular medications prior to the test.  Take metoprolol  (Lopressor ) two hours prior to test. If you take Furosemide/Hydrochlorothiazide/Spironolactone/Chlorthalidone, please HOLD on the morning of the test. Patients who wear a continuous glucose monitor MUST remove the device prior to scanning. FEMALES- please wear underwire-free bra if available, avoid dresses & tight clothing  After the Test: Drink plenty of water. After receiving IV contrast, you may experience a mild flushed feeling. This is normal. On occasion, you may experience a mild rash up to 24 hours after the test. This is not dangerous. If this occurs, you can take Benadryl 25 mg, Zyrtec, Claritin, or Allegra and increase your fluid intake.  (Patients taking Tikosyn should avoid Benadryl, and may take Zyrtec, Claritin, or Allegra) If you experience trouble breathing, this can be serious. If it is severe call 911 IMMEDIATELY. If it is mild, please call our office.  We will call to schedule your test 2-4 weeks out understanding that some insurance companies will need an authorization prior to the service being performed.   For more information and frequently asked questions, please visit our website : [email or web address]  For non-scheduling related questions, please contact the cardiac imaging nurse navigator should you have any questions/concerns: Cardiac Imaging Nurse Navigators Direct Office Dial: [phone]   For scheduling needs, including cancellations and rescheduling, please call Brittany, [phone].   Follow-Up: At [facility], you and your health needs are our priority.  As part of our continuing mission to provide you with exceptional heart care, our providers are all part of one team.  This team includes your primary Cardiologist (physician) and Advanced Practice Providers or APPs (Physician Assistants and Nurse Practitioners) who all work together to provide you with the care you need, when you need it.  Your next appointment:   Follow up will be based on the results of the above testing.   We recommend signing up for the patient portal called MyChart.  Sign up information is provided on this After Visit Summary.  MyChart is used to connect with patients for Virtual Visits (Telemedicine).  Patients are able to view lab/test results, encounter notes, upcoming appointments, etc.  Non-urgent messages can be sent to your provider as well.   To learn more about what you can do with MyChart, go to  [email or web address].

## 2024-08-14 NOTE — Progress Notes (Signed)
 " Cardiology Office Note:  .   Date:  08/14/2024  ID:  Jeffrey Jimenez, DOB 1951-03-14, MRN [id number] PCP: Hugh Charleston, MD (Inactive)  [facility] Providers Cardiologist:  Oneil Parchment, MD     History of Present Illness: .   Jeffrey Jimenez is a 74 y.o. male Discussed the use of AI scribe  History of Present Illness Jeffrey Jimenez is a 74 year old male with prior PSVT post-ablation who presents for evaluation of cardiovascular health. He was referred by Dr. Auston for evaluation of cardiovascular health and management of LDL cholesterol levels.  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 Since then, he has been off Cardizem  120 mg. He recalls experiencing retrograde P waves, likely AVNRT, with a heart rate of 174 bpm in the past. He has not had an EKG since his ablation and is concerned about his cardiovascular health.  He experiences tightness in chest in stressful situations, which he associates with reading about plaque. He plays pickleball approximately five days a week and notes that during intense games, he might feel a little chest pain, winded,  but deep breathing usually alleviates this sensation. He monitors his heart rate with a watch, noting it ranges from 60 to 150 bpm during play, and once reached 160 bpm while in [place] .  He recently experienced hip pain after sleeping wrong, which prevented him from playing pickleball today, but he plans to resume playing this weekend.  He discusses his LDL cholesterol levels, which were brought to his attention by Dr. Auston, who took over from Dr. Quince.      Studies Reviewed: SABRA   EKG Interpretation Date/Time:  Friday August 14 2024 14:17:15 EST Ventricular Rate:  73 PR Interval:  172 QRS Duration:  94 QT Interval:  396 QTC Calculation: 436 R Axis:   -10  Text Interpretation: Normal sinus rhythm Minimal voltage criteria for LVH, may be normal variant (  R in aVL ) Nonspecific T wave abnormality When compared with ECG of 28-Oct-2021 06:41, No significant change since last tracing Confirmed by Parchment Oneil ([id number]) on 08/14/2024 2:42:30 PM    Results Labs LDL: 104 Risk Assessment/Calculations:            Physical Exam:   VS:  BP 130/82   Pulse 73   Ht 5' 10 (1.778 m)   Wt 194 lb 9.6 oz (88.3 kg)   SpO2 97%   BMI 27.92 kg/m    Wt Readings from Last 3 Encounters:  08/14/24 194 lb 9.6 oz (88.3 kg)  10/28/21 185 lb (83.9 kg)  03/01/21 185 lb (83.9 kg)    GEN: Well nourished, well developed in no acute distress NECK: No JVD; No carotid bruits CARDIAC: RRR, no murmurs, no rubs, no gallops RESPIRATORY:  Clear to auscultation without rales, wheezing or rhonchi  ABDOMEN: Soft, non-tender, non-distended EXTREMITIES:  No edema; No deformity   ASSESSMENT AND PLAN: .    Assessment and Plan Assessment & Plan Supraventricular tachycardia status post ablation Status post ablation for supraventricular tachycardia in 2019. No recent episodes reported. Heart rate during physical activity ranges from 60 to 150 bpm, with occasional spikes to 160 bpm during intense activity. No current symptoms suggestive of recurrence. - Continue current management and monitoring of heart rate during physical activity.  Evaluation for coronary artery disease Intermittent chest tightness during stressful situations. No exertional symptoms during regular physical activity. Coronary CT scan planned to evaluate for  plaque and stenosis. Discussed potential use of Crestor if plaque is found to lower LDL cholesterol further. Aspirin therapy discussed with caution due to bleeding risk. - Ordered coronary CT scan to evaluate for plaque and stenosis. - Will consider starting low-dose Crestor if coronary CT scan shows plaque. - Continue aspirin therapy with caution due to bleeding risk.  Hyperlipidemia LDL cholesterol level is 104 mg/dL. Discussed potential need to lower  LDL further if coronary CT scan shows plaque. Current lifestyle includes regular physical activity and a Mediterranean diet. - Continue current lifestyle modifications including regular physical activity and Mediterranean diet. - Will consider starting low-dose Crestor if coronary CT scan shows plaque.         Dispo: Will follow up with results of study  Signed, Oneil Parchment, MD  "

## 2024-08-20 ENCOUNTER — Other Ambulatory Visit: Payer: Self-pay | Admitting: *Deleted

## 2024-08-20 DIAGNOSIS — R072 Precordial pain: Secondary | ICD-10-CM

## 2024-08-24 ENCOUNTER — Encounter (HOSPITAL_COMMUNITY): Payer: Self-pay

## 2024-08-26 ENCOUNTER — Ambulatory Visit (HOSPITAL_COMMUNITY)
Admission: RE | Admit: 2024-08-26 | Discharge: 2024-08-26 | Disposition: A | Discharge status: Home / Self Care | Source: Ambulatory Visit | Attending: Cardiology | Admitting: Cardiology

## 2024-08-26 DIAGNOSIS — R072 Precordial pain: Secondary | ICD-10-CM | POA: Diagnosis present

## 2024-08-26 MED ORDER — IOHEXOL 350 MG/ML SOLN
100.0000 mL | Freq: Once | INTRAVENOUS | Status: AC | PRN
  Administered 2024-08-26: 100 mL via INTRAVENOUS

## 2024-08-26 MED ORDER — NITROGLYCERIN 0.4 MG SL SUBL
0.8000 mg | SUBLINGUAL_TABLET | Freq: Once | SUBLINGUAL | Status: AC
  Administered 2024-08-26: 0.8 mg via SUBLINGUAL

## 2024-08-28 ENCOUNTER — Telehealth: Payer: Self-pay | Admitting: Cardiology

## 2024-08-28 NOTE — Telephone Encounter (Signed)
 Spoke with pt. Advised pt that Dr Jeffrie has not made recommendations yet and we would reach out once that is done. Pt stated understanding.

## 2024-08-28 NOTE — Telephone Encounter (Signed)
 Pt would like a c/b regarding his CT results. Please advice

## 2024-08-29 ENCOUNTER — Ambulatory Visit: Payer: Self-pay | Admitting: Cardiology

## 2024-08-29 DIAGNOSIS — E7800 Pure hypercholesterolemia, unspecified: Secondary | ICD-10-CM

## 2024-08-29 MED ORDER — ROSUVASTATIN CALCIUM 10 MG PO TABS: 10.0000 mg | ORAL_TABLET | Freq: Every day | ORAL | 3 refills | Status: DC

## 2024-08-31 MED ORDER — ROSUVASTATIN CALCIUM 10 MG PO TABS: 10.0000 mg | ORAL_TABLET | Freq: Every day | ORAL | 3 refills | Status: AC

## 2024-09-01 NOTE — Telephone Encounter (Signed)
 Seen by patient Jeffrey Jimenez on 08/31/2024 12:14 PM   Pt has reviewed results and order to take Crestor  10 mg daily.  RX sent into [facility] per Dr Jeffrie. Order placed have Lipid panel in 3 months.
